# Patient Record
Sex: Female | Born: 1960 | Race: Black or African American | Hispanic: No | Smoking: Never smoker
Health system: Southern US, Community
[De-identification: ages and names within clinical notes are randomized; demographics above are authoritative.]

## PROBLEM LIST (undated history)

## (undated) DIAGNOSIS — E042 Nontoxic multinodular goiter: Secondary | ICD-10-CM

## (undated) DIAGNOSIS — K219 Gastro-esophageal reflux disease without esophagitis: Secondary | ICD-10-CM

## (undated) DIAGNOSIS — M1710 Unilateral primary osteoarthritis, unspecified knee: Secondary | ICD-10-CM

## (undated) DIAGNOSIS — I10 Essential (primary) hypertension: Secondary | ICD-10-CM

## (undated) DIAGNOSIS — E785 Hyperlipidemia, unspecified: Secondary | ICD-10-CM

## (undated) DIAGNOSIS — R42 Dizziness and giddiness: Secondary | ICD-10-CM

## (undated) HISTORY — PX: CARPAL TUNNEL RELEASE: SHX101

## (undated) HISTORY — PX: COLONOSCOPY: SHX174

## (undated) HISTORY — PX: KNEE ARTHROSCOPY: SUR90

## (undated) HISTORY — PX: UPPER GI ENDOSCOPY: SHX6162

## (undated) HISTORY — PX: SHOULDER ARTHROSCOPY W/ ACROMIAL REPAIR: SUR94

---

## 2005-06-17 ENCOUNTER — Ambulatory Visit: Payer: Self-pay | Admitting: Internal Medicine

## 2005-07-21 ENCOUNTER — Ambulatory Visit: Payer: Self-pay | Admitting: Unknown Physician Specialty

## 2007-02-27 ENCOUNTER — Emergency Department: Payer: Self-pay | Admitting: Emergency Medicine

## 2007-02-27 ENCOUNTER — Other Ambulatory Visit: Payer: Self-pay

## 2009-05-06 ENCOUNTER — Emergency Department: Payer: Self-pay | Admitting: Emergency Medicine

## 2009-07-17 ENCOUNTER — Ambulatory Visit: Payer: Self-pay | Admitting: Internal Medicine

## 2009-11-24 HISTORY — PX: ENDOMETRIAL ABLATION: SHX621

## 2009-12-17 ENCOUNTER — Ambulatory Visit: Payer: Self-pay | Admitting: Gastroenterology

## 2010-04-16 ENCOUNTER — Ambulatory Visit: Payer: Self-pay | Admitting: Obstetrics and Gynecology

## 2010-04-19 ENCOUNTER — Ambulatory Visit: Payer: Self-pay | Admitting: Obstetrics and Gynecology

## 2010-05-02 ENCOUNTER — Ambulatory Visit: Payer: Self-pay | Admitting: Obstetrics and Gynecology

## 2010-05-10 ENCOUNTER — Ambulatory Visit: Payer: Self-pay | Admitting: Sports Medicine

## 2010-05-16 ENCOUNTER — Ambulatory Visit: Payer: Self-pay | Admitting: Unknown Physician Specialty

## 2010-05-17 ENCOUNTER — Ambulatory Visit: Payer: Self-pay | Admitting: Unknown Physician Specialty

## 2010-08-09 ENCOUNTER — Ambulatory Visit: Payer: Self-pay | Admitting: Internal Medicine

## 2011-09-15 ENCOUNTER — Ambulatory Visit: Payer: Self-pay | Admitting: Internal Medicine

## 2012-12-01 ENCOUNTER — Ambulatory Visit: Payer: Self-pay | Admitting: Obstetrics and Gynecology

## 2014-06-20 ENCOUNTER — Ambulatory Visit: Payer: Self-pay | Admitting: Internal Medicine

## 2015-05-25 ENCOUNTER — Other Ambulatory Visit: Payer: Self-pay | Admitting: Internal Medicine

## 2015-05-25 DIAGNOSIS — Z1231 Encounter for screening mammogram for malignant neoplasm of breast: Secondary | ICD-10-CM

## 2015-06-22 ENCOUNTER — Ambulatory Visit: Admission: RE | Admit: 2015-06-22 | Payer: Self-pay | Source: Ambulatory Visit

## 2015-07-20 ENCOUNTER — Ambulatory Visit
Admission: RE | Admit: 2015-07-20 | Discharge: 2015-07-20 | Disposition: A | Discharge status: Home / Self Care | Payer: 59 | Source: Ambulatory Visit | Attending: Internal Medicine | Admitting: Internal Medicine

## 2015-07-20 DIAGNOSIS — Z1231 Encounter for screening mammogram for malignant neoplasm of breast: Secondary | ICD-10-CM | POA: Insufficient documentation

## 2016-02-11 ENCOUNTER — Ambulatory Visit (INDEPENDENT_AMBULATORY_CARE_PROVIDER_SITE_OTHER): Payer: 59

## 2016-02-11 ENCOUNTER — Ambulatory Visit
Admission: EM | Admit: 2016-02-11 | Discharge: 2016-02-11 | Disposition: A | Discharge status: Home / Self Care | Payer: 59 | Attending: Emergency Medicine | Admitting: Emergency Medicine

## 2016-02-11 ENCOUNTER — Encounter: Payer: Self-pay | Admitting: Emergency Medicine

## 2016-02-11 DIAGNOSIS — M25551 Pain in right hip: Secondary | ICD-10-CM

## 2016-02-11 DIAGNOSIS — M67912 Unspecified disorder of synovium and tendon, left shoulder: Secondary | ICD-10-CM

## 2016-02-11 DIAGNOSIS — S8001XA Contusion of right knee, initial encounter: Secondary | ICD-10-CM | POA: Diagnosis not present

## 2016-02-11 MED ORDER — NAPROXEN 500 MG PO TABS: 500.0000 mg | ORAL_TABLET | Freq: Two times a day (BID) | ORAL | Status: DC

## 2016-02-11 MED ORDER — METAXALONE 800 MG PO TABS: 800.0000 mg | ORAL_TABLET | Freq: Three times a day (TID) | ORAL | Status: DC
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
## 2016-02-11 NOTE — Discharge Instructions (Signed)
Contusion A contusion is a deep bruise. Contusions are the result of a blunt injury to tissues and muscle fibers under the skin. The injury causes bleeding under the skin. The skin overlying the contusion may turn blue, purple, or yellow. Minor injuries will give you a painless contusion, but more severe contusions may stay painful and swollen for a few weeks.  CAUSES  This condition is usually caused by a blow, trauma, or direct force to an area of the body. SYMPTOMS  Symptoms of this condition include:  Swelling of the injured area.  Pain and tenderness in the injured area.  Discoloration. The area may have redness and then turn blue, purple, or yellow. DIAGNOSIS  This condition is diagnosed based on a physical exam and medical history. An X-ray, CT scan, or MRI may be needed to determine if there are any associated injuries, such as broken bones (fractures). TREATMENT  Specific treatment for this condition depends on what area of the body was injured. In general, the best treatment for a contusion is resting, icing, applying pressure to (compression), and elevating the injured area. This is often called the RICE strategy. Over-the-counter anti-inflammatory medicines may also be recommended for pain control.  HOME CARE INSTRUCTIONS   Rest the injured area.  If directed, apply ice to the injured area:  Put ice in a plastic bag.  Place a towel between your skin and the bag.  Leave the ice on for 20 minutes, 2-3 times per day.  If directed, apply light compression to the injured area using an elastic bandage. Make sure the bandage is not wrapped too tightly. Remove and reapply the bandage as directed by your health care provider.  If possible, raise (elevate) the injured area above the level of your heart while you are sitting or lying down.  Take over-the-counter and prescription medicines only as told by your health care provider. SEEK MEDICAL CARE IF:  Your symptoms do not  improve after several days of treatment.  Your symptoms get worse.  You have difficulty moving the injured area. SEEK IMMEDIATE MEDICAL CARE IF:   You have severe pain.  You have numbness in a hand or foot.  Your hand or foot turns pale or cold.   This information is not intended to replace advice given to you by your health care provider. Make sure you discuss any questions you have with your health care provider.   Document Released: 08/20/2005 Document Revised: 08/01/2015 Document Reviewed: 03/28/2015 Elsevier Interactive Patient Education 2016 [place] therapy can help ease sore, stiff, injured, and tight muscles and joints. Heat relaxes your muscles, which may help ease your pain.  RISKS AND COMPLICATIONS If you have any of the following conditions, do not use heat therapy unless your health care provider has approved:  Poor circulation.  Healing wounds or scarred skin in the area being treated.  Diabetes, heart disease, or high blood pressure.  Not being able to feel (numbness) the area being treated.  Unusual swelling of the area being treated.  Active infections.  Blood clots.  Cancer.  Inability to communicate pain. This may include young children and people who have problems with their brain function (dementia).  Pregnancy. Heat therapy should only be used on old, pre-existing, or long-lasting (chronic) injuries. Do not use heat therapy on new injuries unless directed by your health care provider. HOW TO USE HEAT THERAPY There are several different kinds of heat therapy, including:  Moist heat pack.  Warm water bath.  Hot water bottle.  Electric heating pad.  Heated gel pack.  Heated wrap.  Electric heating pad. Use the heat therapy method suggested by your health care provider. Follow your health care provider's instructions on when and how to use heat therapy. GENERAL HEAT THERAPY RECOMMENDATIONS  Do not sleep while  using heat therapy. Only use heat therapy while you are awake.  Your skin may turn pink while using heat therapy. Do not use heat therapy if your skin turns red.  Do not use heat therapy if you have new pain.  High heat or long exposure to heat can cause burns. Be careful when using heat therapy to avoid burning your skin.  Do not use heat therapy on areas of your skin that are already irritated, such as with a rash or sunburn. SEEK MEDICAL CARE IF:  You have blisters, redness, swelling, or numbness.  You have new pain.  Your pain is worse. MAKE SURE YOU:  Understand these instructions.  Will watch your condition.  Will get help right away if you are not doing well or get worse.   This information is not intended to replace advice given to you by your health care provider. Make sure you discuss any questions you have with your health care provider.   Document Released: 02/02/2012 Document Revised: 12/01/2014 Document Reviewed: 01/03/2014 Elsevier Interactive Patient Education [employer]vier Inc[employer]
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
## 2016-05-14 ENCOUNTER — Other Ambulatory Visit: Payer: Self-pay | Admitting: Internal Medicine

## 2016-05-14 DIAGNOSIS — Z1231 Encounter for screening mammogram for malignant neoplasm of breast: Secondary | ICD-10-CM

## 2016-07-21 ENCOUNTER — Other Ambulatory Visit: Payer: Self-pay | Admitting: Internal Medicine

## 2016-07-21 ENCOUNTER — Ambulatory Visit
Admission: RE | Admit: 2016-07-21 | Discharge: 2016-07-21 | Disposition: A | Discharge status: Home / Self Care | Payer: 59 | Source: Ambulatory Visit | Attending: Internal Medicine | Admitting: Internal Medicine

## 2016-07-21 DIAGNOSIS — Z1231 Encounter for screening mammogram for malignant neoplasm of breast: Secondary | ICD-10-CM | POA: Diagnosis present

## 2017-06-19 ENCOUNTER — Other Ambulatory Visit: Payer: Self-pay | Admitting: Internal Medicine

## 2017-06-19 DIAGNOSIS — Z1231 Encounter for screening mammogram for malignant neoplasm of breast: Secondary | ICD-10-CM

## 2017-07-22 ENCOUNTER — Ambulatory Visit
Admission: RE | Admit: 2017-07-22 | Discharge: 2017-07-22 | Disposition: A | Discharge status: Home / Self Care | Payer: 59 | Source: Ambulatory Visit | Attending: Internal Medicine | Admitting: Internal Medicine

## 2017-07-22 DIAGNOSIS — Z1231 Encounter for screening mammogram for malignant neoplasm of breast: Secondary | ICD-10-CM | POA: Insufficient documentation

## 2017-07-28 ENCOUNTER — Other Ambulatory Visit: Payer: Self-pay | Admitting: Internal Medicine

## 2017-07-28 DIAGNOSIS — N6320 Unspecified lump in the left breast, unspecified quadrant: Secondary | ICD-10-CM

## 2017-07-28 DIAGNOSIS — R928 Other abnormal and inconclusive findings on diagnostic imaging of breast: Secondary | ICD-10-CM

## 2017-08-05 ENCOUNTER — Ambulatory Visit
Admission: RE | Admit: 2017-08-05 | Discharge: 2017-08-05 | Disposition: A | Discharge status: Home / Self Care | Payer: 59 | Source: Ambulatory Visit | Attending: Internal Medicine | Admitting: Internal Medicine

## 2017-08-05 DIAGNOSIS — N6320 Unspecified lump in the left breast, unspecified quadrant: Secondary | ICD-10-CM | POA: Diagnosis not present

## 2017-08-05 DIAGNOSIS — N649 Disorder of breast, unspecified: Secondary | ICD-10-CM | POA: Insufficient documentation

## 2017-08-05 DIAGNOSIS — R928 Other abnormal and inconclusive findings on diagnostic imaging of breast: Secondary | ICD-10-CM

## 2017-08-06 ENCOUNTER — Other Ambulatory Visit: Payer: Self-pay | Admitting: Internal Medicine

## 2017-08-06 DIAGNOSIS — R928 Other abnormal and inconclusive findings on diagnostic imaging of breast: Secondary | ICD-10-CM

## 2017-08-06 DIAGNOSIS — N6489 Other specified disorders of breast: Secondary | ICD-10-CM

## 2017-08-13 ENCOUNTER — Ambulatory Visit
Admission: RE | Admit: 2017-08-13 | Discharge: 2017-08-13 | Disposition: A | Discharge status: Home / Self Care | Payer: 59 | Source: Ambulatory Visit | Attending: Internal Medicine | Admitting: Internal Medicine

## 2017-08-13 DIAGNOSIS — N6489 Other specified disorders of breast: Secondary | ICD-10-CM

## 2017-08-13 DIAGNOSIS — R928 Other abnormal and inconclusive findings on diagnostic imaging of breast: Secondary | ICD-10-CM

## 2017-08-13 HISTORY — PX: BREAST BIOPSY: SHX20

## 2017-08-14 LAB — SURGICAL PATHOLOGY

## 2018-04-03 IMAGING — MG MM BREAST BX W LOC DEV 1ST LESION IMAGE BX SPEC STEREO GUIDE*L*
8 of 18 series · 8 of 30 positions shown · non-contrast
Comparison: Previous exams.

ADDENDUM:
Pathology of the left breast biopsy revealed A. LEFT BREAST, UPPER
OUTER QUADRANT; STEREOTACTIC BIOPSY: FIBROCYSTIC CHANGE WITH
MICROCALCIFICATIONS. This was found to be concordant with Dr. Si Kacang Polong
impression and notes.

Recommendations:  Bilateral screening mammogram in one year.
At the patient's request, results and recommendations were relayed
to the patient by phone by Ceola, Ervista on 08/14/17 at [DATE].
The patient stated she has done well following the biopsy with no
bleeding, bruising, or hematoma. Post biopsy instructions were
reviewed with the patient. All of her questions were answered. She
was encouraged to call the [HOSPITAL] with any further
questions or concerns.
Addendum by Ceola, Ervista on 08/14/17.
CLINICAL DATA: 56-year-old female for tomosynthesis guided biopsy
of an indeterminate left breast asymmetry
EXAM:
LEFT BREAST STEREOTACTIC CORE NEEDLE BIOPSY

[L (1 of 8)]
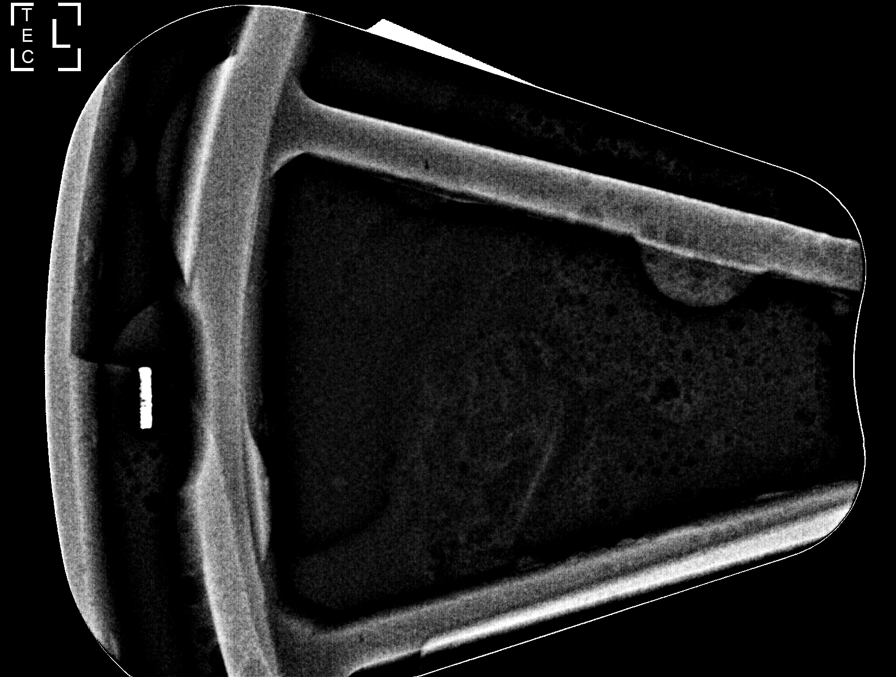

[L (2 of 8)]
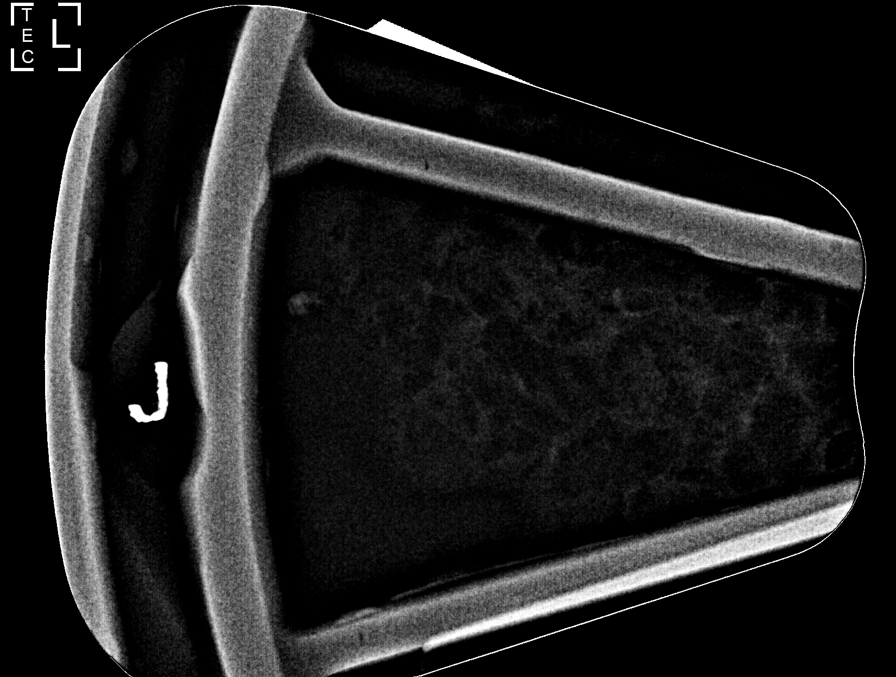

[L (3 of 8)]
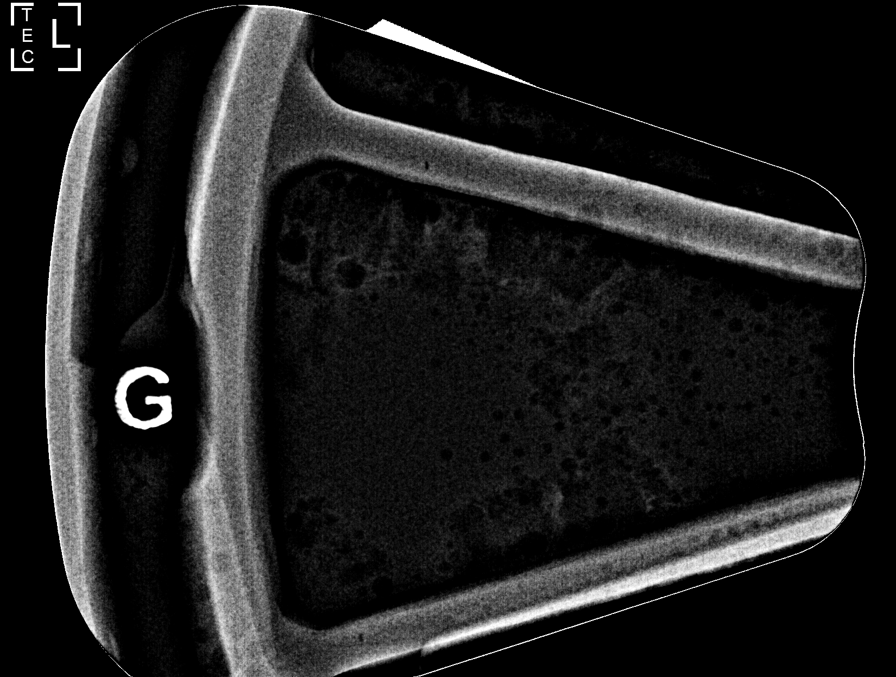

[L (4 of 8)]
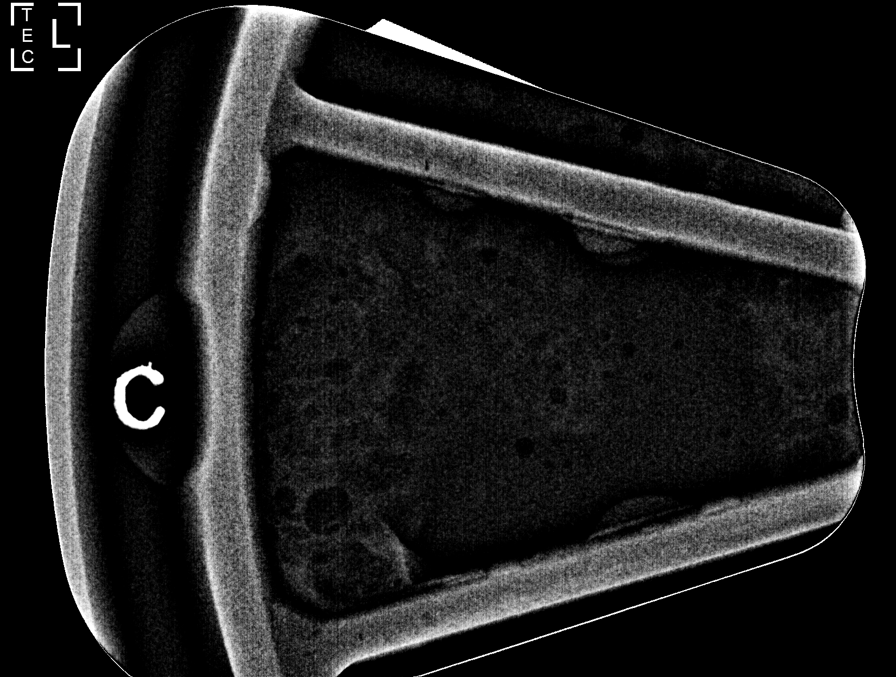

[L (5 of 8)]
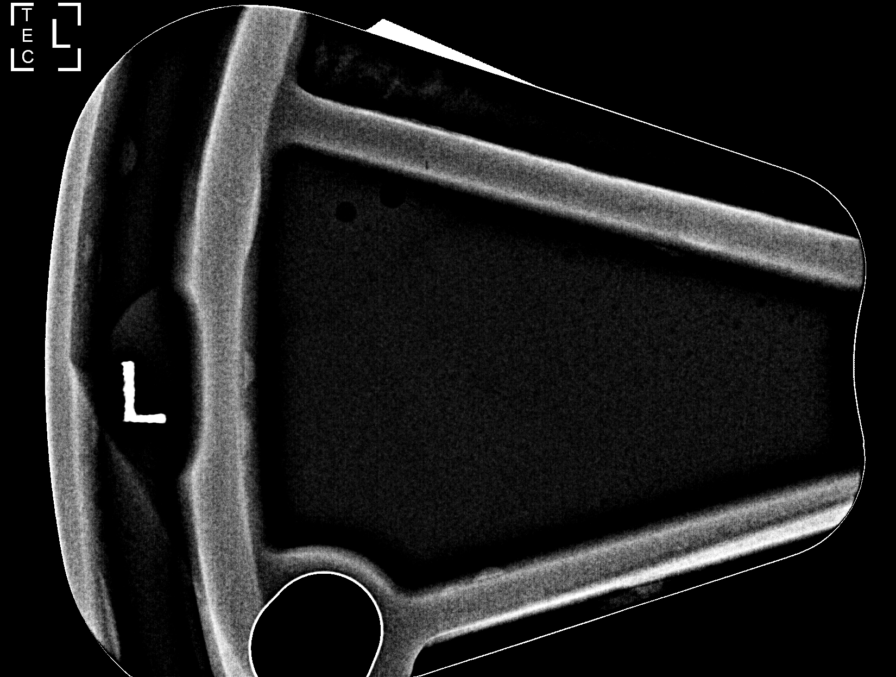

[L (6 of 8)]
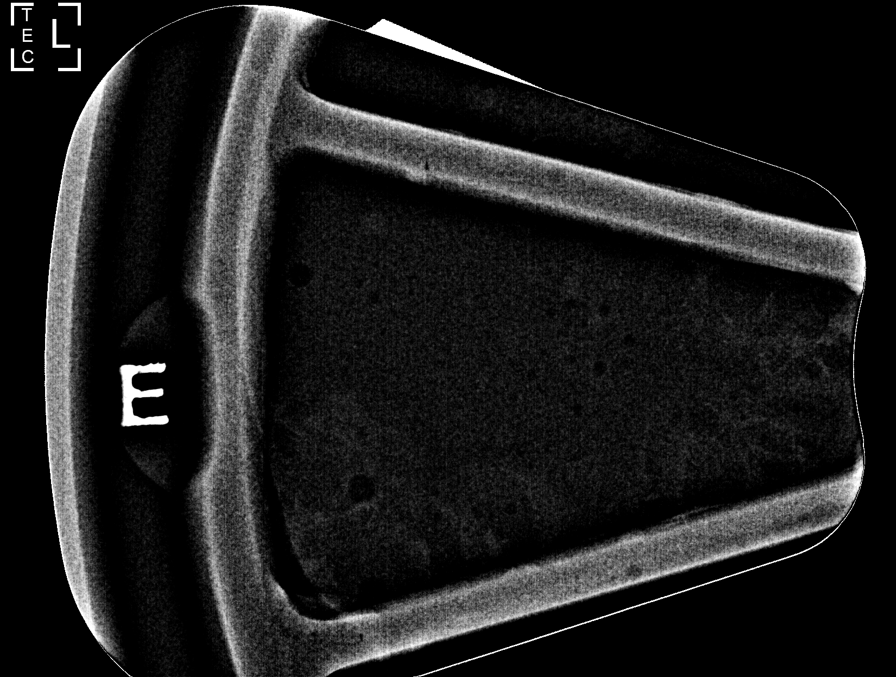

[L (7 of 8)]
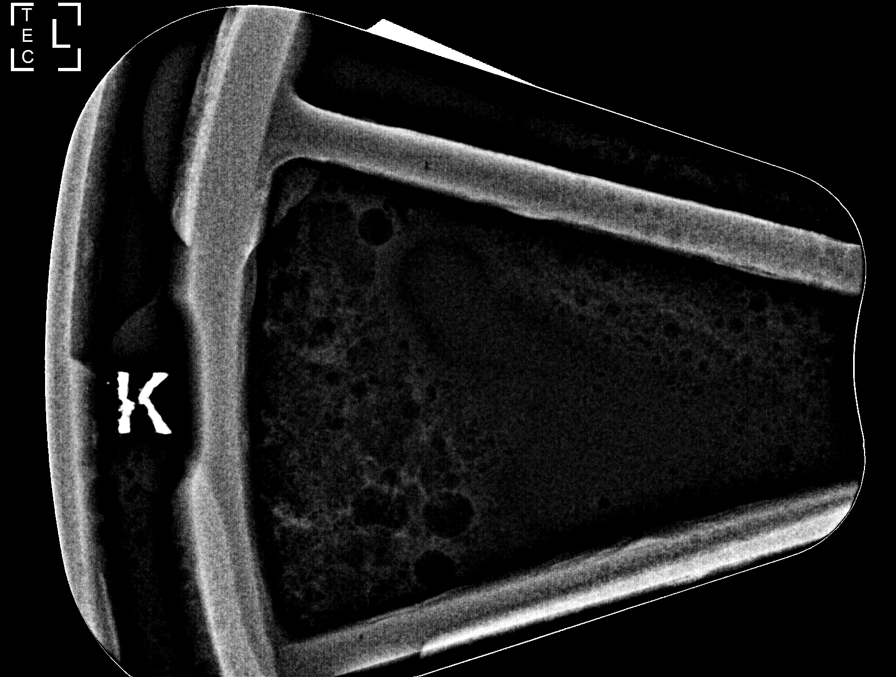

[L (8 of 8)]
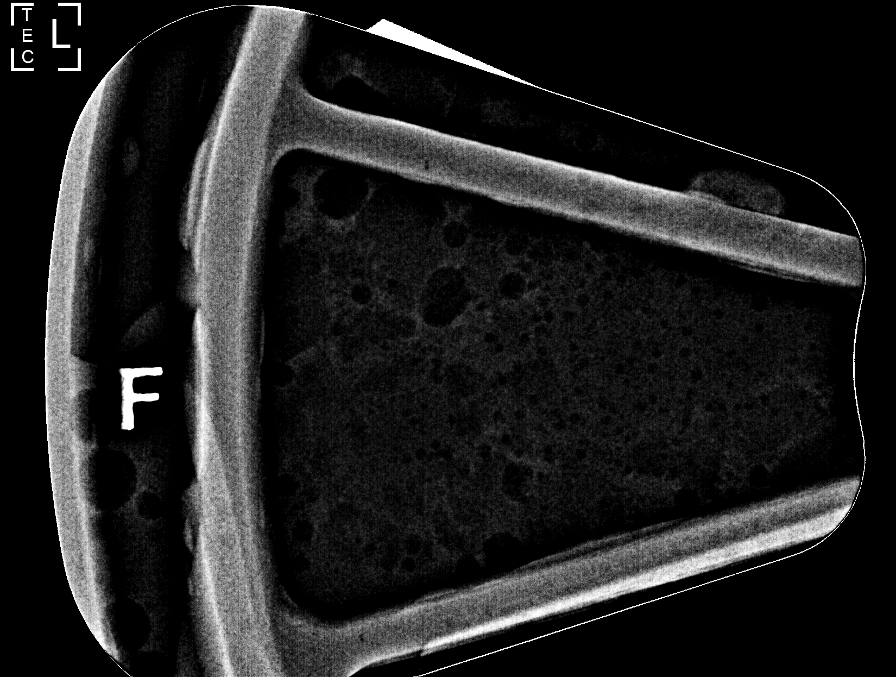

[8 of 30 positions shown; findings below may reference images not displayed]

FINDINGS: The patient and I discussed the procedure of stereotactic/
tomosynthesis -guided biopsy including benefits and alternatives. We
discussed the high likelihood of a successful procedure. We
discussed the risks of the procedure including infection, bleeding,
tissue injury, clip migration, and inadequate sampling. Informed
written consent was given. The usual time out protocol was performed
immediately prior to the procedure.

Using sterile technique and 1% Lidocaine as local anesthetic, under
stereotactic guidance, a 9 gauge vacuum assisted device was used to
perform core needle biopsy of an asymmetry in the upper, outer left
breast using a superior to inferior approach. Specimen radiograph
was performed.

Lesion quadrant: Upper outer quadrant

At the conclusion of the procedure, an X shaped tissue marker clip
was deployed into the biopsy cavity. Follow-up 2-view mammogram was
performed and dictated separately.
IMPRESSION: Stereotactic/ tomosynthesis -guided biopsy of an indeterminate left
breast asymmetry. No apparent complications.

## 2018-08-13 ENCOUNTER — Other Ambulatory Visit: Payer: Self-pay | Admitting: Internal Medicine

## 2018-08-13 DIAGNOSIS — Z1231 Encounter for screening mammogram for malignant neoplasm of breast: Secondary | ICD-10-CM

## 2018-08-24 ENCOUNTER — Ambulatory Visit
Admission: RE | Admit: 2018-08-24 | Discharge: 2018-08-24 | Disposition: A | Discharge status: Home / Self Care | Payer: Managed Care, Other (non HMO) | Source: Ambulatory Visit | Attending: Internal Medicine | Admitting: Internal Medicine

## 2018-08-24 DIAGNOSIS — Z1231 Encounter for screening mammogram for malignant neoplasm of breast: Secondary | ICD-10-CM | POA: Insufficient documentation

## 2019-07-13 ENCOUNTER — Other Ambulatory Visit: Payer: Self-pay | Admitting: Internal Medicine

## 2019-07-13 DIAGNOSIS — Z1231 Encounter for screening mammogram for malignant neoplasm of breast: Secondary | ICD-10-CM

## 2019-08-26 ENCOUNTER — Ambulatory Visit
Admission: RE | Admit: 2019-08-26 | Discharge: 2019-08-26 | Disposition: A | Discharge status: Home / Self Care | Payer: Managed Care, Other (non HMO) | Source: Ambulatory Visit | Attending: Internal Medicine | Admitting: Internal Medicine

## 2019-08-26 DIAGNOSIS — Z1231 Encounter for screening mammogram for malignant neoplasm of breast: Secondary | ICD-10-CM | POA: Diagnosis present

## 2020-02-28 ENCOUNTER — Other Ambulatory Visit: Payer: Self-pay | Admitting: Gastroenterology

## 2020-02-28 DIAGNOSIS — R1312 Dysphagia, oropharyngeal phase: Secondary | ICD-10-CM

## 2020-03-22 ENCOUNTER — Ambulatory Visit
Admission: RE | Admit: 2020-03-22 | Discharge: 2020-03-22 | Disposition: A | Discharge status: Home / Self Care | Payer: Managed Care, Other (non HMO) | Source: Ambulatory Visit | Attending: Gastroenterology | Admitting: Gastroenterology

## 2020-03-22 ENCOUNTER — Other Ambulatory Visit: Payer: Self-pay

## 2020-03-22 DIAGNOSIS — R1312 Dysphagia, oropharyngeal phase: Secondary | ICD-10-CM | POA: Diagnosis present

## 2020-08-24 ENCOUNTER — Other Ambulatory Visit: Payer: Self-pay | Admitting: Internal Medicine

## 2020-08-24 DIAGNOSIS — Z1231 Encounter for screening mammogram for malignant neoplasm of breast: Secondary | ICD-10-CM

## 2020-08-31 ENCOUNTER — Ambulatory Visit
Admission: RE | Admit: 2020-08-31 | Discharge: 2020-08-31 | Disposition: A | Discharge status: Home / Self Care | Payer: Managed Care, Other (non HMO) | Source: Ambulatory Visit | Attending: Internal Medicine | Admitting: Internal Medicine

## 2020-08-31 ENCOUNTER — Other Ambulatory Visit: Payer: Self-pay

## 2020-08-31 DIAGNOSIS — Z1231 Encounter for screening mammogram for malignant neoplasm of breast: Secondary | ICD-10-CM | POA: Diagnosis present

## 2020-10-03 ENCOUNTER — Other Ambulatory Visit: Payer: Managed Care, Other (non HMO) | Attending: Gastroenterology

## 2020-10-05 ENCOUNTER — Other Ambulatory Visit: Payer: Self-pay

## 2020-10-05 ENCOUNTER — Other Ambulatory Visit
Admission: RE | Admit: 2020-10-05 | Discharge: 2020-10-05 | Disposition: A | Discharge status: Home / Self Care | Payer: Managed Care, Other (non HMO) | Source: Ambulatory Visit | Attending: Gastroenterology | Admitting: Gastroenterology

## 2020-10-05 ENCOUNTER — Ambulatory Visit: Admission: RE | Admit: 2020-10-05 | Payer: Managed Care, Other (non HMO) | Source: Home / Self Care

## 2020-10-05 ENCOUNTER — Encounter: Admission: RE | Payer: Self-pay | Source: Home / Self Care

## 2020-10-05 DIAGNOSIS — Z20822 Contact with and (suspected) exposure to covid-19: Secondary | ICD-10-CM | POA: Insufficient documentation

## 2020-10-05 DIAGNOSIS — Z01812 Encounter for preprocedural laboratory examination: Secondary | ICD-10-CM | POA: Insufficient documentation

## 2020-10-05 SURGERY — ESOPHAGOGASTRODUODENOSCOPY (EGD) WITH PROPOFOL
Anesthesia: General

## 2020-10-06 LAB — SARS CORONAVIRUS 2 (TAT 6-24 HRS): SARS Coronavirus 2: NEGATIVE

## 2020-10-08 ENCOUNTER — Encounter: Payer: Self-pay | Admitting: *Deleted

## 2020-10-09 ENCOUNTER — Encounter
Admission: RE | Disposition: A | Discharge status: Home / Self Care | Payer: Self-pay | Source: Home / Self Care | Attending: Gastroenterology

## 2020-10-09 ENCOUNTER — Ambulatory Visit: Payer: Managed Care, Other (non HMO) | Admitting: Anesthesiology

## 2020-10-09 ENCOUNTER — Ambulatory Visit
Admission: RE | Admit: 2020-10-09 | Discharge: 2020-10-09 | Disposition: A | Discharge status: Home / Self Care | Payer: Managed Care, Other (non HMO) | Attending: Gastroenterology | Admitting: Gastroenterology

## 2020-10-09 ENCOUNTER — Other Ambulatory Visit: Payer: Self-pay

## 2020-10-09 ENCOUNTER — Encounter: Payer: Self-pay | Admitting: *Deleted

## 2020-10-09 DIAGNOSIS — K2950 Unspecified chronic gastritis without bleeding: Secondary | ICD-10-CM | POA: Diagnosis not present

## 2020-10-09 DIAGNOSIS — R1310 Dysphagia, unspecified: Secondary | ICD-10-CM | POA: Insufficient documentation

## 2020-10-09 DIAGNOSIS — R1012 Left upper quadrant pain: Secondary | ICD-10-CM | POA: Diagnosis present

## 2020-10-09 DIAGNOSIS — Z791 Long term (current) use of non-steroidal anti-inflammatories (NSAID): Secondary | ICD-10-CM | POA: Insufficient documentation

## 2020-10-09 DIAGNOSIS — Z79899 Other long term (current) drug therapy: Secondary | ICD-10-CM | POA: Insufficient documentation

## 2020-10-09 HISTORY — DX: Nontoxic multinodular goiter: E04.2

## 2020-10-09 HISTORY — DX: Hyperlipidemia, unspecified: E78.5

## 2020-10-09 HISTORY — DX: Unilateral primary osteoarthritis, unspecified knee: M17.10

## 2020-10-09 HISTORY — PX: ESOPHAGOGASTRODUODENOSCOPY (EGD) WITH PROPOFOL: SHX5813

## 2020-10-09 SURGERY — ESOPHAGOGASTRODUODENOSCOPY (EGD) WITH PROPOFOL
Anesthesia: General

## 2020-10-09 MED ORDER — SODIUM CHLORIDE 0.9 % IV SOLN: INTRAVENOUS | Status: DC

## 2020-10-09 MED ORDER — LIDOCAINE HCL (CARDIAC) PF 100 MG/5ML IV SOSY
PREFILLED_SYRINGE | INTRAVENOUS | Status: DC | PRN
  Administered 2020-10-09: 100 mg via INTRAVENOUS

## 2020-10-09 MED ORDER — GLYCOPYRROLATE 0.2 MG/ML IJ SOLN
INTRAMUSCULAR | Status: DC | PRN
  Administered 2020-10-09: .2 mg via INTRAVENOUS

## 2020-10-09 MED ORDER — PROPOFOL 10 MG/ML IV BOLUS
INTRAVENOUS | Status: DC | PRN
  Administered 2020-10-09: 10 mg via INTRAVENOUS
  Administered 2020-10-09: 20 mg via INTRAVENOUS
  Administered 2020-10-09: 50 mg via INTRAVENOUS
  Administered 2020-10-09: 20 mg via INTRAVENOUS

## 2020-10-09 MED ORDER — PROPOFOL 500 MG/50ML IV EMUL
INTRAVENOUS | Status: DC | PRN
  Administered 2020-10-09: 145 ug/kg/min via INTRAVENOUS

## 2020-10-09 NOTE — Anesthesia Postprocedure Evaluation (Signed)
Anesthesia Post Note  Patient: Jessica Ochoa  Procedure(s) Performed: ESOPHAGOGASTRODUODENOSCOPY (EGD) WITH PROPOFOL (N/A )  Patient location during evaluation: Endoscopy Anesthesia Type: General Level of consciousness: awake and alert and oriented Pain management: pain level controlled Vital Signs Assessment: post-procedure vital signs reviewed and stable Respiratory status: spontaneous breathing, nonlabored ventilation and respiratory function stable Cardiovascular status: blood pressure returned to baseline and stable Postop Assessment: no signs of nausea or vomiting Anesthetic complications: no   No complications documented.   Last Vitals:  Vitals:   10/09/20 1229 10/09/20 1239  BP: 128/71 (!) 141/73  Pulse:  86  Resp:    Temp:    SpO2: 97% 99%    Last Pain:  Vitals:   10/09/20 1239  TempSrc:   PainSc: 0-No pain                 Jo Booze

## 2020-10-09 NOTE — Anesthesia Preprocedure Evaluation (Signed)
Anesthesia Evaluation  Patient identified by MRN, date of birth, ID band Patient awake    Reviewed: Allergy & Precautions, NPO status , Patient's Chart, lab work & pertinent test results  History of Anesthesia Complications Negative for: history of anesthetic complications  Airway Mallampati: II  TM Distance: >3 FB Neck ROM: Full    Dental no notable dental hx.    Pulmonary neg pulmonary ROS, neg sleep apnea, neg COPD,    breath sounds clear to auscultation- rhonchi (-) wheezing      Cardiovascular Exercise Tolerance: Good (-) hypertension(-) CAD, (-) Past MI, (-) Cardiac Stents and (-) CABG  Rhythm:Regular Rate:Normal - Systolic murmurs and - Diastolic murmurs    Neuro/Psych neg Seizures negative neurological ROS  negative psych ROS   GI/Hepatic negative GI ROS, Neg liver ROS,   Endo/Other  negative endocrine ROSneg diabetes  Renal/GU negative Renal ROS     Musculoskeletal  (+) Arthritis ,   Abdominal (+) + obese,   Peds  Hematology negative hematology ROS (+)   Anesthesia Other Findings Past Medical History: No date: Hyperlipidemia No date: Multinodular goiter No date: Primary osteoarthritis of knee   Reproductive/Obstetrics                             Anesthesia Physical Anesthesia Plan  ASA: II  Anesthesia Plan: General   Post-op Pain Management:    Induction: Intravenous  PONV Risk Score and Plan: 2 and Propofol infusion  Airway Management Planned: Natural Airway  Additional Equipment:   Intra-op Plan:   Post-operative Plan:   Informed Consent: I have reviewed the patients History and Physical, chart, labs and discussed the procedure including the risks, benefits and alternatives for the proposed anesthesia with the patient or authorized representative who has indicated his/her understanding and acceptance.     Dental advisory given  Plan Discussed with: CRNA  and Anesthesiologist  Anesthesia Plan Comments:         Anesthesia Quick Evaluation

## 2020-10-09 NOTE — Interval H&P Note (Signed)
History and Physical Interval Note:  10/09/2020 11:49 AM  Jessica Ochoa  has presented today for surgery, with the diagnosis of LUQ PAIN.  The various methods of treatment have been discussed with the patient and family. After consideration of risks, benefits and other options for treatment, the patient has consented to  Procedure(s): ESOPHAGOGASTRODUODENOSCOPY (EGD) WITH PROPOFOL (N/A) as a surgical intervention.  The patient's history has been reviewed, patient examined, no change in status, stable for surgery.  I have reviewed the patient's chart and labs.  Questions were answered to the patient's satisfaction.     Regis Bill  Ok to proceed with EGD

## 2020-10-09 NOTE — Op Note (Signed)
[facility] Gastroenterology Patient Name: Jessica Ochoa Procedure Date: 10/09/2020 11:52 AM MRN: [id number] Account #: [id number] Date of Birth: 1961/01/29 Admit Type: Outpatient Age: 59 Room: [facility] ENDO ROOM 1 Gender: Female Note Status: Finalized Procedure:             Upper GI endoscopy Indications:           Abdominal pain in the left upper quadrant, Dysphagia Providers:             Andrey Farmer MD, MD Referring MD:          Rusty Aus, MD (Referring MD) Medicines:             Monitored Anesthesia Care Complications:         No immediate complications. Estimated blood loss:                         Minimal. Procedure:             Pre-Anesthesia Assessment:                        - Prior to the procedure, a History and Physical was                         performed, and patient medications and allergies were                         reviewed. The patient is competent. The risks and                         benefits of the procedure and the sedation options and                         risks were discussed with the patient. All questions                         were answered and informed consent was obtained.                         Patient identification and proposed procedure were                         verified by the physician, the nurse, the anesthetist                         and the technician in the endoscopy suite. Mental                         Status Examination: alert and oriented. Airway                         Examination: normal oropharyngeal airway and neck                         mobility. Respiratory Examination: clear to                         auscultation. CV Examination: normal. Prophylactic  Antibiotics: The patient does not require prophylactic                         antibiotics. Prior Anticoagulants: The patient has                         taken no previous anticoagulant or antiplatelet                          agents. ASA Grade Assessment: III - A patient with                         severe systemic disease. After reviewing the risks and                         benefits, the patient was deemed in satisfactory                         condition to undergo the procedure. The anesthesia                         plan was to use monitored anesthesia care (MAC).                         Immediately prior to administration of medications,                         the patient was re-assessed for adequacy to receive                         sedatives. The heart rate, respiratory rate, oxygen                         saturations, blood pressure, adequacy of pulmonary                         ventilation, and response to care were monitored                         throughout the procedure. The physical status of the                         patient was re-assessed after the procedure.                        After obtaining informed consent, the endoscope was                         passed under direct vision. Throughout the procedure,                         the patient's blood pressure, pulse, and oxygen                         saturations were monitored continuously. The Endoscope                         was introduced through the mouth, and advanced to the  second part of duodenum. The upper GI endoscopy was                         accomplished without difficulty. The patient tolerated                         the procedure well. Findings:      No endoscopic abnormality was evident in the esophagus to explain the       patient's complaint of dysphagia. Biopsies were obtained from the       proximal and distal esophagus with cold forceps for histology of       suspected eosinophilic esophagitis. Estimated blood loss was minimal.      Patchy mild inflammation characterized by erythema was found in the       gastric antrum. Biopsies were taken with a cold forceps for Helicobacter        pylori testing. Estimated blood loss was minimal.      The examined duodenum was normal. Impression:            - No endoscopic esophageal abnormality to explain                         patient's dysphagia. Biopsied.                        - Gastritis. Biopsied.                        - Normal examined duodenum. Recommendation:        - Discharge patient to home.                        - Resume previous diet.                        - Continue present medications.                        - Await pathology results.                        - Return to referring physician as previously                         scheduled. Procedure Code(s):     --- Professional ---                        [phone], Esophagogastroduodenoscopy, flexible,                         transoral; with biopsy, single or multiple Diagnosis Code(s):     --- Professional ---                        R13.10, Dysphagia, unspecified                        K29.70, Gastritis, unspecified, without bleeding                        R10.12, Left upper quadrant pain CPT copyright 2019 [employer]. All rights reserved. The codes documented  in this report are preliminary and upon coder review may  be revised to meet current compliance requirements. Andrey Farmer, MD Andrey Farmer MD, MD 10/09/2020 12:08:58 PM Number of Addenda: 0 Note Initiated On: 10/09/2020 11:52 AM Estimated Blood Loss:  Estimated blood loss was minimal.      [facility]

## 2020-10-09 NOTE — Anesthesia Procedure Notes (Signed)
Procedure Name: General with mask airway Performed by: Fletcher-Harrison, Dortha Neighbors, CRNA Pre-anesthesia Checklist: Patient identified, Emergency Drugs available, Suction available and Patient being monitored Patient Re-evaluated:Patient Re-evaluated prior to induction Oxygen Delivery Method: Simple face mask Induction Type: IV induction Placement Confirmation: positive ETCO2 and CO2 detector Dental Injury: Teeth and Oropharynx as per pre-operative assessment        

## 2020-10-09 NOTE — Transfer of Care (Signed)
Immediate Anesthesia Transfer of Care Note  Patient: Jessica Ochoa  Procedure(s) Performed: ESOPHAGOGASTRODUODENOSCOPY (EGD) WITH PROPOFOL (N/A )  Patient Location: Endoscopy Unit  Anesthesia Type:General  Level of Consciousness: awake, drowsy and patient cooperative  Airway & Oxygen Therapy: Patient Spontanous Breathing  Post-op Assessment: Report given to RN and Post -op Vital signs reviewed and stable  Post vital signs: Reviewed and stable  Last Vitals:  Vitals Value Taken Time  BP 122/61 10/09/20 1209  Temp 36.4 C 10/09/20 1209  Pulse 91 10/09/20 1212  Resp 23 10/09/20 1212  SpO2 98 % 10/09/20 1212  Vitals shown include unvalidated device data.  Last Pain:  Vitals:   10/09/20 1209  TempSrc: Temporal  PainSc: 0-No pain         Complications: No complications documented.

## 2020-10-09 NOTE — H&P (Signed)
Outpatient short stay form Pre-procedure 10/09/2020 11:47 AM Merlyn Lot MD, MPH  Primary Physician: Dr. Hyacinth Meeker  Reason for visit:  LUQ pain and dysphagia  History of present illness:   59 y/o lady here for EGD for above symptoms. 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 Takes NSAIDS daily. No blood thinners. No family history of GI malignancies.    Current Facility-Administered Medications:  .  0.9 %  sodium chloride infusion, , Intravenous, Continuous, Aysia Lowder, Rossie Muskrat, MD, Last Rate: 20 mL/hr at 10/09/20 1147, New Bag at 10/09/20 1147  Medications Prior to Admission  Medication Sig Dispense Refill Last Dose  . buPROPion (WELLBUTRIN XL) 300 MG 24 hr tablet Take 300 mg by mouth daily.   10/08/2020 at Unknown time  . cholecalciferol (VITAMIN D) 1000 units tablet Take 2,000 Units by mouth daily.   10/08/2020 at Unknown time  . etodolac (LODINE) 400 MG tablet Take 400 mg by mouth 2 (two) times daily.   10/08/2020 at Unknown time  . famotidine (PEPCID) 40 MG tablet Take 40 mg by mouth daily.     . hydrochlorothiazide (HYDRODIURIL) 25 MG tablet Take 25 mg by mouth daily.   10/08/2020 at Unknown time  . pantoprazole (PROTONIX) 40 MG tablet Take 40 mg by mouth daily.   10/08/2020 at Unknown time  . simvastatin (ZOCOR) 20 MG tablet Take 20 mg by mouth daily.   10/08/2020 at Unknown time  . naproxen (NAPROSYN) 500 MG tablet Take 1 tablet (500 mg total) by mouth 2 (two) times daily with a meal. 60 tablet 0      No Known Allergies   Past Medical History:  Diagnosis Date  . Hyperlipidemia   . Multinodular goiter   . Primary osteoarthritis of knee     Review of systems:  Otherwise negative.    Physical Exam  Gen: Alert, oriented. Appears stated age.  HEENT: PERRLA. Lungs: No respiratory distress CV: RRR Abd: soft, benign, no masses. Ext: No edema.    Planned  procedures: Proceed with EGD. The patient understands the nature of the planned procedure, indications, risks, alternatives and potential complications including but not limited to bleeding, infection, perforation, damage to internal organs and possible oversedation/side effects from anesthesia. The patient agrees and gives consent to proceed.  Please refer to procedure notes for findings, recommendations and patient disposition/instructions.     Merlyn Lot MD, MPH Gastroenterology 10/09/2020  11:47 AM

## 2020-10-10 ENCOUNTER — Encounter: Payer: Self-pay | Admitting: Gastroenterology

## 2020-10-10 LAB — SURGICAL PATHOLOGY

## 2021-09-02 ENCOUNTER — Encounter: Payer: Self-pay | Admitting: Ophthalmology

## 2021-09-03 ENCOUNTER — Encounter: Payer: Self-pay | Admitting: Anesthesiology

## 2021-09-06 ENCOUNTER — Other Ambulatory Visit: Payer: Self-pay | Admitting: Internal Medicine

## 2021-09-06 DIAGNOSIS — Z1231 Encounter for screening mammogram for malignant neoplasm of breast: Secondary | ICD-10-CM

## 2021-09-17 ENCOUNTER — Ambulatory Visit
Admission: RE | Admit: 2021-09-17 | Payer: Managed Care, Other (non HMO) | Source: Home / Self Care | Admitting: Ophthalmology

## 2021-09-17 HISTORY — DX: Gastro-esophageal reflux disease without esophagitis: K21.9

## 2021-09-17 HISTORY — DX: Dizziness and giddiness: R42

## 2021-09-17 SURGERY — PHACOEMULSIFICATION, CATARACT, WITH IOL INSERTION
Anesthesia: Topical | Laterality: Right

## 2021-09-23 ENCOUNTER — Other Ambulatory Visit: Payer: Self-pay

## 2021-09-23 ENCOUNTER — Ambulatory Visit
Admission: RE | Admit: 2021-09-23 | Discharge: 2021-09-23 | Disposition: A | Discharge status: Home / Self Care | Payer: Managed Care, Other (non HMO) | Source: Ambulatory Visit | Attending: Internal Medicine | Admitting: Internal Medicine

## 2021-09-23 DIAGNOSIS — Z1231 Encounter for screening mammogram for malignant neoplasm of breast: Secondary | ICD-10-CM | POA: Insufficient documentation

## 2021-10-01 ENCOUNTER — Ambulatory Visit
Admission: RE | Admit: 2021-10-01 | Payer: Managed Care, Other (non HMO) | Source: Home / Self Care | Admitting: Ophthalmology

## 2021-10-01 ENCOUNTER — Encounter: Admission: RE | Payer: Self-pay | Source: Home / Self Care

## 2021-10-01 SURGERY — PHACOEMULSIFICATION, CATARACT, WITH IOL INSERTION
Anesthesia: Topical | Laterality: Left

## 2022-08-26 ENCOUNTER — Other Ambulatory Visit: Payer: Self-pay | Admitting: Internal Medicine

## 2022-08-26 DIAGNOSIS — Z1231 Encounter for screening mammogram for malignant neoplasm of breast: Secondary | ICD-10-CM

## 2022-09-24 ENCOUNTER — Ambulatory Visit
Admission: RE | Admit: 2022-09-24 | Discharge: 2022-09-24 | Disposition: A | Discharge status: Home / Self Care | Payer: Managed Care, Other (non HMO) | Source: Ambulatory Visit | Attending: Internal Medicine | Admitting: Internal Medicine

## 2022-09-24 DIAGNOSIS — Z1231 Encounter for screening mammogram for malignant neoplasm of breast: Secondary | ICD-10-CM | POA: Insufficient documentation

## 2023-03-23 ENCOUNTER — Encounter: Payer: Self-pay | Admitting: Ophthalmology

## 2023-03-27 NOTE — Discharge Instructions (Signed)

## 2023-03-31 ENCOUNTER — Ambulatory Visit: Payer: Managed Care, Other (non HMO) | Admitting: Anesthesiology

## 2023-03-31 ENCOUNTER — Encounter
Admission: RE | Disposition: A | Discharge status: Home / Self Care | Payer: Self-pay | Source: Home / Self Care | Attending: Ophthalmology

## 2023-03-31 ENCOUNTER — Ambulatory Visit
Admission: RE | Admit: 2023-03-31 | Discharge: 2023-03-31 | Disposition: A | Discharge status: Home / Self Care | Payer: Managed Care, Other (non HMO) | Attending: Ophthalmology | Admitting: Ophthalmology

## 2023-03-31 ENCOUNTER — Other Ambulatory Visit: Payer: Self-pay

## 2023-03-31 DIAGNOSIS — I10 Essential (primary) hypertension: Secondary | ICD-10-CM | POA: Insufficient documentation

## 2023-03-31 DIAGNOSIS — K219 Gastro-esophageal reflux disease without esophagitis: Secondary | ICD-10-CM | POA: Diagnosis not present

## 2023-03-31 DIAGNOSIS — E785 Hyperlipidemia, unspecified: Secondary | ICD-10-CM | POA: Diagnosis not present

## 2023-03-31 DIAGNOSIS — H2512 Age-related nuclear cataract, left eye: Secondary | ICD-10-CM | POA: Insufficient documentation

## 2023-03-31 HISTORY — PX: CATARACT EXTRACTION W/PHACO: SHX586

## 2023-03-31 HISTORY — DX: Essential (primary) hypertension: I10

## 2023-03-31 SURGERY — PHACOEMULSIFICATION, CATARACT, WITH IOL INSERTION
Anesthesia: Monitor Anesthesia Care | Site: Eye | Laterality: Left

## 2023-03-31 MED ORDER — SIGHTPATH DOSE#1 BSS IO SOLN
INTRAOCULAR | Status: DC | PRN
  Administered 2023-03-31: 55 mL via OPHTHALMIC

## 2023-03-31 MED ORDER — LACTATED RINGERS IV SOLN: INTRAVENOUS | Status: DC

## 2023-03-31 MED ORDER — FENTANYL CITRATE (PF) 100 MCG/2ML IJ SOLN
INTRAMUSCULAR | Status: DC | PRN
  Administered 2023-03-31 (×2): 50 ug via INTRAVENOUS

## 2023-03-31 MED ORDER — SIGHTPATH DOSE#1 BSS IO SOLN
INTRAOCULAR | Status: DC | PRN
  Administered 2023-03-31: 30 mL

## 2023-03-31 MED ORDER — SIGHTPATH DOSE#1 BSS IO SOLN
INTRAOCULAR | Status: DC | PRN
  Administered 2023-03-31: 1 mL via INTRAMUSCULAR

## 2023-03-31 MED ORDER — SIGHTPATH DOSE#1 NA CHONDROIT SULF-NA HYALURON 40-17 MG/ML IO SOLN
INTRAOCULAR | Status: DC | PRN
  Administered 2023-03-31: 1 mL via INTRAOCULAR

## 2023-03-31 MED ORDER — MOXIFLOXACIN HCL 0.5 % OP SOLN
OPHTHALMIC | Status: DC | PRN
  Administered 2023-03-31: .2 mL via OPHTHALMIC

## 2023-03-31 MED ORDER — BRIMONIDINE TARTRATE-TIMOLOL 0.2-0.5 % OP SOLN
OPHTHALMIC | Status: DC | PRN
  Administered 2023-03-31: 1 [drp] via OPHTHALMIC

## 2023-03-31 MED ORDER — TETRACAINE HCL 0.5 % OP SOLN
1.0000 [drp] | OPHTHALMIC | Status: DC | PRN
  Administered 2023-03-31 (×3): 1 [drp] via OPHTHALMIC

## 2023-03-31 MED ORDER — ARMC OPHTHALMIC DILATING DROPS
1.0000 | OPHTHALMIC | Status: DC | PRN
  Administered 2023-03-31 (×3): 1 via OPHTHALMIC

## 2023-03-31 MED ORDER — MIDAZOLAM HCL 2 MG/2ML IJ SOLN
INTRAMUSCULAR | Status: DC | PRN
  Administered 2023-03-31: 2 mg via INTRAVENOUS

## 2023-03-31 SURGICAL SUPPLY — 17 items
ANGLE REVERSE CUT SHRT 25GA (CUTTER) ×1
CANNULA ANT/CHMB 27G (MISCELLANEOUS) IMPLANT
CANNULA ANT/CHMB 27GA (MISCELLANEOUS) IMPLANT
CATARACT SUITE SIGHTPATH (MISCELLANEOUS) ×1 IMPLANT
CYSTOTOME ANGL RVRS SHRT 25G (CUTTER) IMPLANT
CYSTOTOME ANGL RVRS SHRT 25GA (CUTTER) ×1 IMPLANT
FEE CATARACT SUITE SIGHTPATH (MISCELLANEOUS) ×1 IMPLANT
GLOVE BIOGEL PI IND STRL 8 (GLOVE) ×1 IMPLANT
GLOVE SURG ENC TEXT LTX SZ8 (GLOVE) ×1 IMPLANT
LENS IOL TECNIS EYHANCE 20.5 (Intraocular Lens) IMPLANT
NDL FILTER BLUNT 18X1 1/2 (NEEDLE) ×1 IMPLANT
NEEDLE FILTER BLUNT 18X1 1/2 (NEEDLE) ×1 IMPLANT
PACK VIT ANT 23G (MISCELLANEOUS) IMPLANT
RING MALYGIN (MISCELLANEOUS) IMPLANT
SUT ETHILON 10-0 CS-B-6CS-B-6 (SUTURE)
SUTURE EHLN 10-0 CS-B-6CS-B-6 (SUTURE) IMPLANT
SYR 3ML LL SCALE MARK (SYRINGE) ×1 IMPLANT

## 2023-03-31 NOTE — Anesthesia Postprocedure Evaluation (Signed)
Anesthesia Post Note  Patient: Jessica Ochoa  Procedure(s) Performed: CATARACT EXTRACTION PHACO AND INTRAOCULAR LENS PLACEMENT (IOC) LEFT (Left: Eye)  Patient location during evaluation: PACU Anesthesia Type: MAC Level of consciousness: awake and alert Pain management: pain level controlled Vital Signs Assessment: post-procedure vital signs reviewed and stable Respiratory status: spontaneous breathing, nonlabored ventilation, respiratory function stable and patient connected to nasal cannula oxygen Cardiovascular status: stable and blood pressure returned to baseline Postop Assessment: no apparent nausea or vomiting Anesthetic complications: no   No notable events documented.   Last Vitals:  Vitals:   03/31/23 1049 03/31/23 1054  BP: (!) 133/93 (!) 142/86  Pulse: 90 88  Resp:  13  Temp: (!) 36.2 C (!) 36.2 C  SpO2: 100% 98%    Last Pain:  Vitals:   03/31/23 1054  PainSc: 0-No pain                 Kay Shippy C Lorenz Donley

## 2023-03-31 NOTE — Transfer of Care (Signed)
Immediate Anesthesia Transfer of Care Note  Patient: Jessica Ochoa  Procedure(s) Performed: CATARACT EXTRACTION PHACO AND INTRAOCULAR LENS PLACEMENT (IOC) LEFT (Left: Eye)  Patient Location: PACU  Anesthesia Type: MAC  Level of Consciousness: awake, alert  and patient cooperative  Airway and Oxygen Therapy: Patient Spontanous Breathing and Patient connected to supplemental oxygen  Post-op Assessment: Post-op Vital signs reviewed, Patient's Cardiovascular Status Stable, Respiratory Function Stable, Patent Airway and No signs of Nausea or vomiting  Post-op Vital Signs: Reviewed and stable  Complications: No notable events documented.

## 2023-03-31 NOTE — H&P (Signed)
[facility]   Primary Care Physician:  Danella Penton, MD Ophthalmologist: Dr. Druscilla Brownie  Pre-Procedure History & Physical: HPI:  Jessica Ochoa is a 62 y.o. female here for cataract surgery.   Past Medical History:  Diagnosis Date   GERD (gastroesophageal reflux disease)    Hyperlipidemia    Hypertension    Multinodular goiter    Primary osteoarthritis of knee    Vertigo     Past Surgical History:  Procedure Laterality Date   BREAST BIOPSY Left 08/13/2017   bx /clip-neg   CARPAL TUNNEL RELEASE     COLONOSCOPY     ENDOMETRIAL ABLATION  2011   ESOPHAGOGASTRODUODENOSCOPY (EGD) WITH PROPOFOL N/A 10/09/2020   Procedure: ESOPHAGOGASTRODUODENOSCOPY (EGD) WITH PROPOFOL;  Surgeon: Regis Bill, MD;  Location: [facility] END[facility];  Service: Endoscopy;  Laterality: N/A;   KNEE ARTHROSCOPY     SHOULDER ARTHROSCOPY W/ ACROMIAL REPAIR     UPPER GI ENDOSCOPY      Prior to Admission medications   Medication Sig Start Date End Date Taking? Authorizing Provider  acetaminophen (ACETAMINOPHEN 8 HOUR) 650 MG CR tablet Take 1,300 mg by mouth 2 (two) times daily.   Yes [provider]  buPROPion (WELLBUTRIN XL) 300 MG 24 hr tablet Take 300 mg by mouth daily.   Yes [provider]  cholecalciferol (VITAMIN D) 1000 units tablet Take 2,000 Units by mouth daily.   Yes [provider]  olmesartan-hydrochlorothiazide (BENICAR HCT) 20-12.5 MG tablet Take 1 tablet by mouth daily.   Yes [provider]  pantoprazole (PROTONIX) 40 MG tablet Take 40 mg by mouth.   Yes [provider]  simvastatin (ZOCOR) 20 MG tablet Take 20 mg by mouth daily.   Yes [provider]  ELDERBERRY PO Take by mouth daily. Patient not taking: Reported on 03/23/2023    [provider]  hydrochlorothiazide (HYDRODIURIL) 25 MG tablet Take 25 mg by mouth daily. Patient not taking: Reported on 03/23/2023    [provider]    Allergies as of  03/09/2023   (No Known Allergies)    Family History  Problem Relation Age of Onset   Breast cancer Maternal Aunt     Social History   Socioeconomic History   Marital status: Divorced    Spouse name: Not on file   Number of children: Not on file   Years of education: Not on file   Highest education level: Not on file  Occupational History   Not on file  Tobacco Use   Smoking status: Never   Smokeless tobacco: Never  Vaping Use   Vaping Use: Never used  Substance and Sexual Activity   Alcohol use: Yes    Comment: occasional   Drug use: Not on file   Sexual activity: Not on file  Other Topics Concern   Not on file  Social History Narrative   Not on file   Social Determinants of Health   Financial Resource Strain: Not on file  Food Insecurity: Not on file  Transportation Needs: Not on file  Physical Activity: Not on file  Stress: Not on file  Social Connections: Not on file  Intimate Partner Violence: Not on file    Review of Systems: See HPI, otherwise negative ROS  Physical Exam: BP (!) 152/87   Pulse 95   Temp 98.2 F (36.8 C)   Resp 16   Ht 5\' 4"  (1.626 m)   Wt 98.9 kg   SpO2 98%   BMI 37.42  kg/m  General:   Alert, cooperative in NAD Head:  Normocephalic and atraumatic. Respiratory:  Normal work of breathing. Cardiovascular:  RRR  Impression/Plan: Jessica Ochoa is here for cataract surgery.  Risks, benefits, limitations, and alternatives regarding cataract surgery have been reviewed with the patient.  Questions have been answered.  All parties agreeable.   Galen Manila, MD  03/31/2023, 10:25 AM

## 2023-03-31 NOTE — Anesthesia Preprocedure Evaluation (Signed)
Anesthesia Evaluation  Patient identified by MRN, date of birth, ID band Patient awake    Reviewed: Allergy & Precautions, H&P , NPO status , Patient's Chart, lab work & pertinent test results  Airway Mallampati: III  TM Distance: >3 FB Neck ROM: Full    Dental  (+) Chipped Chips noted both upper central incisors:   Pulmonary neg pulmonary ROS   Pulmonary exam normal breath sounds clear to auscultation       Cardiovascular hypertension, negative cardio ROS Normal cardiovascular exam Rhythm:Regular Rate:Normal     Neuro/Psych negative neurological ROS  negative psych ROS   GI/Hepatic Neg liver ROS,GERD  ,,  Endo/Other  negative endocrine ROS    Renal/GU negative Renal ROS  negative genitourinary   Musculoskeletal  (+) Arthritis ,    Abdominal   Peds negative pediatric ROS (+)  Hematology negative hematology ROS (+)   Anesthesia Other Findings Multinodular goiter  Hyperlipidemia Primary osteoarthritis of knee  Vertigo GERD (gastroesophageal reflux disease) Hypertension    Reproductive/Obstetrics negative OB ROS                             Anesthesia Physical Anesthesia Plan  ASA: 2  Anesthesia Plan: MAC   Post-op Pain Management:    Induction: Intravenous  PONV Risk Score and Plan:   Airway Management Planned: Natural Airway and Nasal Cannula  Additional Equipment:   Intra-op Plan:   Post-operative Plan:   Informed Consent: I have reviewed the patients History and Physical, chart, labs and discussed the procedure including the risks, benefits and alternatives for the proposed anesthesia with the patient or authorized representative who has indicated his/her understanding and acceptance.     Dental Advisory Given  Plan Discussed with: Anesthesiologist, CRNA and Surgeon  Anesthesia Plan Comments: (Patient consented for risks of anesthesia including but not limited  to:  - adverse reactions to medications - damage to eyes, teeth, lips or other oral mucosa - nerve damage due to positioning  - sore throat or hoarseness - Damage to heart, brain, nerves, lungs, other parts of body or loss of life  Patient voiced understanding.)       Anesthesia Quick Evaluation

## 2023-03-31 NOTE — Op Note (Signed)
PREOPERATIVE DIAGNOSIS:  Nuclear sclerotic cataract of the left eye.   POSTOPERATIVE DIAGNOSIS:  Nuclear sclerotic cataract of the left eye.   OPERATIVE PROCEDURE:ORPROCALL@   SURGEON:  Galen Manila, MD.   ANESTHESIA:  Anesthesiologist: Marisue Humble, MD CRNA: Barbette Hair, CRNA  1.      Managed anesthesia care. 2.     0.40ml of Shugarcaine was instilled following the paracentesis   COMPLICATIONS:  None.   TECHNIQUE:   Stop and chop   DESCRIPTION OF PROCEDURE:  The patient was examined and consented in the preoperative holding area where the aforementioned topical anesthesia was applied to the left eye and then brought back to the Operating Room where the left eye was prepped and draped in the usual sterile ophthalmic fashion and a lid speculum was placed. A paracentesis was created with the side port blade and the anterior chamber was filled with viscoelastic. A near clear corneal incision was performed with the steel keratome. A continuous curvilinear capsulorrhexis was performed with a cystotome followed by the capsulorrhexis forceps. Hydrodissection and hydrodelineation were carried out with BSS on a blunt cannula. The lens was removed in a stop and chop  technique and the remaining cortical material was removed with the irrigation-aspiration handpiece. The capsular bag was inflated with viscoelastic and the Technis [id number] lens was placed in the capsular bag without complication. The remaining viscoelastic was removed from the eye with the irrigation-aspiration handpiece. The wounds were hydrated. The anterior chamber was flushed with BSS and the eye was inflated to physiologic pressure. 0.63ml Vigamox was placed in the anterior chamber. The wounds were found to be water tight. The eye was dressed with Combigan. The patient was given protective glasses to wear throughout the day and a shield with which to sleep tonight. The patient was also given drops with which to begin a drop regimen  today and will follow-up with me in one day. Implant Name Type Inv. Item Serial No. Manufacturer Lot No. LRB No. Used Action  LENS IOL TECNIS EYHANCE 20.5 - [id number] Intraocular Lens LENS IOL TECNIS EYHANCE 20.5 [id number] [employer]  Left 1 Implanted    Procedure(s) with comments: CATARACT EXTRACTION PHACO AND INTRAOCULAR LENS PLACEMENT (IOC) LEFT (Left) - 3.90  00:28.4  Electronically signed: Galen Manila 03/31/2023 10:48 AM

## 2023-04-01 ENCOUNTER — Encounter: Payer: Self-pay | Admitting: Ophthalmology

## 2023-04-09 ENCOUNTER — Encounter: Payer: Self-pay | Admitting: Ophthalmology

## 2023-04-10 NOTE — Discharge Instructions (Signed)

## 2023-04-14 ENCOUNTER — Ambulatory Visit: Payer: Managed Care, Other (non HMO) | Admitting: Anesthesiology

## 2023-04-14 ENCOUNTER — Ambulatory Visit
Admission: RE | Admit: 2023-04-14 | Discharge: 2023-04-14 | Disposition: A | Discharge status: Home / Self Care | Payer: Managed Care, Other (non HMO) | Attending: Ophthalmology | Admitting: Ophthalmology

## 2023-04-14 ENCOUNTER — Other Ambulatory Visit: Payer: Self-pay

## 2023-04-14 ENCOUNTER — Encounter: Payer: Self-pay | Admitting: Ophthalmology

## 2023-04-14 ENCOUNTER — Encounter
Admission: RE | Disposition: A | Discharge status: Home / Self Care | Payer: Self-pay | Source: Home / Self Care | Attending: Ophthalmology

## 2023-04-14 DIAGNOSIS — K219 Gastro-esophageal reflux disease without esophagitis: Secondary | ICD-10-CM | POA: Diagnosis not present

## 2023-04-14 DIAGNOSIS — E785 Hyperlipidemia, unspecified: Secondary | ICD-10-CM | POA: Diagnosis not present

## 2023-04-14 DIAGNOSIS — H2511 Age-related nuclear cataract, right eye: Secondary | ICD-10-CM | POA: Insufficient documentation

## 2023-04-14 DIAGNOSIS — I10 Essential (primary) hypertension: Secondary | ICD-10-CM | POA: Insufficient documentation

## 2023-04-14 HISTORY — PX: CATARACT EXTRACTION W/PHACO: SHX586

## 2023-04-14 SURGERY — PHACOEMULSIFICATION, CATARACT, WITH IOL INSERTION
Anesthesia: Monitor Anesthesia Care | Site: Eye | Laterality: Right

## 2023-04-14 MED ORDER — SIGHTPATH DOSE#1 NA CHONDROIT SULF-NA HYALURON 40-17 MG/ML IO SOLN
INTRAOCULAR | Status: DC | PRN
  Administered 2023-04-14: 1 mL via INTRAOCULAR

## 2023-04-14 MED ORDER — FENTANYL CITRATE (PF) 100 MCG/2ML IJ SOLN
INTRAMUSCULAR | Status: DC | PRN
  Administered 2023-04-14: 100 ug via INTRAVENOUS

## 2023-04-14 MED ORDER — LACTATED RINGERS IV SOLN: INTRAVENOUS | Status: DC

## 2023-04-14 MED ORDER — BRIMONIDINE TARTRATE-TIMOLOL 0.2-0.5 % OP SOLN
OPHTHALMIC | Status: DC | PRN
  Administered 2023-04-14: 1 [drp] via OPHTHALMIC

## 2023-04-14 MED ORDER — TETRACAINE HCL 0.5 % OP SOLN
1.0000 [drp] | OPHTHALMIC | Status: DC | PRN
  Administered 2023-04-14 (×3): 1 [drp] via OPHTHALMIC

## 2023-04-14 MED ORDER — MOXIFLOXACIN HCL 0.5 % OP SOLN
OPHTHALMIC | Status: DC | PRN
  Administered 2023-04-14: .2 mL via OPHTHALMIC

## 2023-04-14 MED ORDER — SIGHTPATH DOSE#1 BSS IO SOLN
INTRAOCULAR | Status: DC | PRN
  Administered 2023-04-14: 1 mL via INTRAMUSCULAR

## 2023-04-14 MED ORDER — ARMC OPHTHALMIC DILATING DROPS
1.0000 | OPHTHALMIC | Status: DC | PRN
  Administered 2023-04-14 (×3): 1 via OPHTHALMIC

## 2023-04-14 MED ORDER — SIGHTPATH DOSE#1 BSS IO SOLN
INTRAOCULAR | Status: DC | PRN
  Administered 2023-04-14: 15 mL

## 2023-04-14 MED ORDER — ACETAMINOPHEN 500 MG PO TABS
1000.0000 mg | ORAL_TABLET | Freq: Four times a day (QID) | ORAL | Status: DC | PRN
  Administered 2023-04-14: 1000 mg via ORAL

## 2023-04-14 MED ORDER — SIGHTPATH DOSE#1 BSS IO SOLN
INTRAOCULAR | Status: DC | PRN
  Administered 2023-04-14: 54 mL via OPHTHALMIC

## 2023-04-14 MED ORDER — MIDAZOLAM HCL 2 MG/2ML IJ SOLN
INTRAMUSCULAR | Status: DC | PRN
  Administered 2023-04-14: 2 mg via INTRAVENOUS

## 2023-04-14 SURGICAL SUPPLY — 11 items
ANGLE REVERSE CUT SHRT 25GA (CUTTER) ×1
CATARACT SUITE SIGHTPATH (MISCELLANEOUS) ×1 IMPLANT
CYSTOTOME ANGL RVRS SHRT 25G (CUTTER) ×1 IMPLANT
CYSTOTOME ANGL RVRS SHRT 25GA (CUTTER) ×1 IMPLANT
FEE CATARACT SUITE SIGHTPATH (MISCELLANEOUS) ×1 IMPLANT
GLOVE BIOGEL PI IND STRL 8 (GLOVE) ×1 IMPLANT
GLOVE SURG ENC TEXT LTX SZ8 (GLOVE) ×1 IMPLANT
LENS IOL TECNIS EYHANCE 21.5 (Intraocular Lens) IMPLANT
NDL FILTER BLUNT 18X1 1/2 (NEEDLE) ×1 IMPLANT
NEEDLE FILTER BLUNT 18X1 1/2 (NEEDLE) ×1 IMPLANT
SYR 3ML LL SCALE MARK (SYRINGE) ×1 IMPLANT

## 2023-04-14 NOTE — Transfer of Care (Signed)
Immediate Anesthesia Transfer of Care Note  Patient: Jessica Ochoa  Procedure(s) Performed: CATARACT EXTRACTION PHACO AND INTRAOCULAR LENS PLACEMENT (IOC) RIGHT  4.24  00:20.7 (Right: Eye)  Patient Location: PACU  Anesthesia Type: MAC  Level of Consciousness: awake, alert  and patient cooperative  Airway and Oxygen Therapy: Patient Spontanous Breathing and Patient connected to supplemental oxygen  Post-op Assessment: Post-op Vital signs reviewed, Patient's Cardiovascular Status Stable, Respiratory Function Stable, Patent Airway and No signs of Nausea or vomiting  Post-op Vital Signs: Reviewed and stable  Complications: No notable events documented.

## 2023-04-14 NOTE — Anesthesia Preprocedure Evaluation (Signed)
Anesthesia Evaluation  Patient identified by MRN, date of birth, ID band Patient awake    Reviewed: Allergy & Precautions, H&P , NPO status , Patient's Chart, lab work & pertinent test results  Airway Mallampati: III  TM Distance: >3 FB Neck ROM: Full    Dental no notable dental hx. (+) Chipped Chipped upper central incisors:   Pulmonary neg pulmonary ROS   Pulmonary exam normal breath sounds clear to auscultation       Cardiovascular hypertension, negative cardio ROS Normal cardiovascular exam Rhythm:Regular Rate:Normal     Neuro/Psych negative neurological ROS  negative psych ROS   GI/Hepatic Neg liver ROS,GERD  ,,  Endo/Other  negative endocrine ROS    Renal/GU negative Renal ROS  negative genitourinary   Musculoskeletal  (+) Arthritis ,    Abdominal   Peds negative pediatric ROS (+)  Hematology negative hematology ROS (+)   Anesthesia Other Findings Multinodular goiter  Hyperlipidemia Primary osteoarthritis of knee  Vertigo GERD (gastroesophageal reflux disease) Hypertension    Reproductive/Obstetrics negative OB ROS                              Anesthesia Physical Anesthesia Plan  ASA: 2  Anesthesia Plan: MAC   Post-op Pain Management:    Induction: Intravenous  PONV Risk Score and Plan:   Airway Management Planned: Natural Airway and Nasal Cannula  Additional Equipment:   Intra-op Plan:   Post-operative Plan:   Informed Consent: I have reviewed the patients History and Physical, chart, labs and discussed the procedure including the risks, benefits and alternatives for the proposed anesthesia with the patient or authorized representative who has indicated his/her understanding and acceptance.     Dental Advisory Given  Plan Discussed with: Anesthesiologist, CRNA and Surgeon  Anesthesia Plan Comments: (Patient consented for risks of anesthesia including but  not limited to:  - adverse reactions to medications - damage to eyes, teeth, lips or other oral mucosa - nerve damage due to positioning  - sore throat or hoarseness - Damage to heart, brain, nerves, lungs, other parts of body or loss of life  Patient voiced understanding.)         Anesthesia Quick Evaluation

## 2023-04-14 NOTE — H&P (Signed)
[facility]   Primary Care Physician:  Danella Penton, MD Ophthalmologist: Dr. Druscilla Brownie  Pre-Procedure History & Physical: HPI:  Jessica Ochoa is a 62 y.o. female here for cataract surgery.   Past Medical History:  Diagnosis Date   GERD (gastroesophageal reflux disease)    Hyperlipidemia    Hypertension    Multinodular goiter    Primary osteoarthritis of knee    Vertigo     Past Surgical History:  Procedure Laterality Date   BREAST BIOPSY Left 08/13/2017   bx /clip-neg   CARPAL TUNNEL RELEASE     CATARACT EXTRACTION W/PHACO Left 03/31/2023   Procedure: CATARACT EXTRACTION PHACO AND INTRAOCULAR LENS PLACEMENT (IOC) LEFT;  Surgeon: Galen Manila, MD;  Location: [facility];  Service: Ophthalmology;  Laterality: Left;  3.90  00:28.4   COLONOSCOPY     ENDOMETRIAL ABLATION  2011   ESOPHAGOGASTRODUODENOSCOPY (EGD) WITH PROPOFOL N/A 10/09/2020   Procedure: ESOPHAGOGASTRODUODENOSCOPY (EGD) WITH PROPOFOL;  Surgeon: Regis Bill, MD;  Location: [facility];  Service: Endoscopy;  Laterality: N/A;   KNEE ARTHROSCOPY     SHOULDER ARTHROSCOPY W/ ACROMIAL REPAIR     UPPER GI ENDOSCOPY      Prior to Admission medications   Medication Sig Start Date End Date Taking? Authorizing Provider  acetaminophen (ACETAMINOPHEN 8 HOUR) 650 MG CR tablet Take 1,300 mg by mouth 2 (two) times daily.   Yes [provider]  buPROPion (WELLBUTRIN XL) 300 MG 24 hr tablet Take 300 mg by mouth daily.   Yes [provider]  cholecalciferol (VITAMIN D) 1000 units tablet Take 2,000 Units by mouth daily.   Yes [provider]  olmesartan-hydrochlorothiazide (BENICAR HCT) 20-12.5 MG tablet Take 1 tablet by mouth daily.   Yes [provider]  pantoprazole (PROTONIX) 40 MG tablet Take 40 mg by mouth.   Yes [provider]  simvastatin (ZOCOR) 20 MG tablet Take 20 mg by mouth daily.   Yes [provider]  ELDERBERRY PO Take by mouth  daily. Patient not taking: Reported on 03/23/2023    [provider]  hydrochlorothiazide (HYDRODIURIL) 25 MG tablet Take 25 mg by mouth daily. Patient not taking: Reported on 03/23/2023    [provider]    Allergies as of 03/09/2023   (No Known Allergies)    Family History  Problem Relation Age of Onset   Breast cancer Maternal Aunt     Social History   Socioeconomic History   Marital status: Divorced    Spouse name: Not on file   Number of children: Not on file   Years of education: Not on file   Highest education level: Not on file  Occupational History   Not on file  Tobacco Use   Smoking status: Never   Smokeless tobacco: Never  Vaping Use   Vaping Use: Never used  Substance and Sexual Activity   Alcohol use: Yes    Comment: occasional   Drug use: Not on file   Sexual activity: Not on file  Other Topics Concern   Not on file  Social History Narrative   Not on file   Social Determinants of Health   Financial Resource Strain: Not on file  Food Insecurity: Not on file  Transportation Needs: Not on file  Physical Activity: Not on file  Stress: Not on file  Social Connections: Not on file  Intimate Partner Violence: Not on file    Review of Systems: See HPI, otherwise negative ROS  Physical  Exam: BP 139/73   Pulse 93   Temp 97.9 F (36.6 C) (Temporal)   Resp 15   Ht 5' 4.02" (1.626 m)   Wt 100.2 kg   SpO2 99%   BMI 37.90 kg/m  General:   Alert, cooperative in NAD Head:  Normocephalic and atraumatic. Respiratory:  Normal work of breathing. Cardiovascular:  RRR  Impression/Plan: Jessica Ochoa is here for cataract surgery.  Risks, benefits, limitations, and alternatives regarding cataract surgery have been reviewed with the patient.  Questions have been answered.  All parties agreeable.   Galen Manila, MD  04/14/2023, 12:21 PM

## 2023-04-14 NOTE — Op Note (Signed)
PREOPERATIVE DIAGNOSIS:  Nuclear sclerotic cataract of the right eye.   POSTOPERATIVE DIAGNOSIS:  H25.11 Cataract   OPERATIVE PROCEDURE:ORPROCALL@   SURGEON:  Galen Manila, MD.   ANESTHESIA:  Anesthesiologist: Marisue Humble, MD CRNA: Domenic Moras, CRNA  1.      Managed anesthesia care. 2.      0.10ml of Shugarcaine was instilled in the eye following the paracentesis.   COMPLICATIONS:  None.   TECHNIQUE:   Stop and chop   DESCRIPTION OF PROCEDURE:  The patient was examined and consented in the preoperative holding area where the aforementioned topical anesthesia was applied to the right eye and then brought back to the Operating Room where the right eye was prepped and draped in the usual sterile ophthalmic fashion and a lid speculum was placed. A paracentesis was created with the side port blade and the anterior chamber was filled with viscoelastic. A near clear corneal incision was performed with the steel keratome. A continuous curvilinear capsulorrhexis was performed with a cystotome followed by the capsulorrhexis forceps. Hydrodissection and hydrodelineation were carried out with BSS on a blunt cannula. The lens was removed in a stop and chop  technique and the remaining cortical material was removed with the irrigation-aspiration handpiece. The capsular bag was inflated with viscoelastic and the Technis [id number]  lens was placed in the capsular bag without complication. The remaining viscoelastic was removed from the eye with the irrigation-aspiration handpiece. The wounds were hydrated. The anterior chamber was flushed with BSS and the eye was inflated to physiologic pressure. 0.31ml of Vigamox was placed in the anterior chamber. The wounds were found to be water tight. The eye was dressed with Combigan. The patient was given protective glasses to wear throughout the day and a shield with which to sleep tonight. The patient was also given drops with which to begin a drop regimen today  and will follow-up with me in one day. Implant Name Type Inv. Item Serial No. Manufacturer Lot No. LRB No. Used Action  LENS IOL TECNIS EYHANCE 21.5 - [id number] Intraocular Lens LENS IOL TECNIS EYHANCE 21.5 [id number] [employer]  Right 1 Implanted   Procedure(s): CATARACT EXTRACTION PHACO AND INTRAOCULAR LENS PLACEMENT (IOC) RIGHT  4.24  00:20.7 (Right)  Electronically signed: Galen Manila 04/14/2023 12:56 PM

## 2023-04-14 NOTE — Anesthesia Postprocedure Evaluation (Signed)
Anesthesia Post Note  Patient: NYRIE LAUE  Procedure(s) Performed: CATARACT EXTRACTION PHACO AND INTRAOCULAR LENS PLACEMENT (IOC) RIGHT  4.24  00:20.7 (Right: Eye)  Patient location during evaluation: PACU Anesthesia Type: MAC Level of consciousness: awake and alert Pain management: pain level controlled Vital Signs Assessment: post-procedure vital signs reviewed and stable Respiratory status: spontaneous breathing, nonlabored ventilation, respiratory function stable and patient connected to nasal cannula oxygen Cardiovascular status: stable and blood pressure returned to baseline Postop Assessment: no apparent nausea or vomiting Anesthetic complications: no   No notable events documented.   Last Vitals:  Vitals:   04/14/23 1257 04/14/23 1304  BP: 118/82 128/82  Pulse: 85 91  Resp: 15 16  Temp: 36.6 C 36.6 C  SpO2: 99% 98%    Last Pain:  Vitals:   04/14/23 1304  TempSrc:   PainSc: 2                  Osric Klopf C Sharae Zappulla

## 2023-04-15 NOTE — Progress Notes (Signed)
Pt stated  "I felt rushed. Had that been my first eye, I wouldn't have had my second eye done. I don't feel like I was numb enough and I felt everything that was happening."

## 2023-09-07 ENCOUNTER — Other Ambulatory Visit: Payer: Self-pay | Admitting: Internal Medicine

## 2023-09-07 DIAGNOSIS — Z1231 Encounter for screening mammogram for malignant neoplasm of breast: Secondary | ICD-10-CM

## 2023-09-29 ENCOUNTER — Ambulatory Visit
Admission: RE | Admit: 2023-09-29 | Discharge: 2023-09-29 | Disposition: A | Discharge status: Home / Self Care | Payer: Managed Care, Other (non HMO) | Source: Ambulatory Visit | Attending: Internal Medicine | Admitting: Internal Medicine

## 2023-09-29 DIAGNOSIS — Z1231 Encounter for screening mammogram for malignant neoplasm of breast: Secondary | ICD-10-CM | POA: Insufficient documentation

## 2023-10-26 ENCOUNTER — Ambulatory Visit
Admission: EM | Admit: 2023-10-26 | Discharge: 2023-10-26 | Disposition: A | Discharge status: Home / Self Care | Payer: Managed Care, Other (non HMO) | Attending: Internal Medicine | Admitting: Internal Medicine

## 2023-10-26 ENCOUNTER — Ambulatory Visit: Payer: Managed Care, Other (non HMO)

## 2023-10-26 DIAGNOSIS — M25562 Pain in left knee: Secondary | ICD-10-CM

## 2023-10-26 DIAGNOSIS — M25552 Pain in left hip: Secondary | ICD-10-CM

## 2023-10-26 DIAGNOSIS — M1712 Unilateral primary osteoarthritis, left knee: Secondary | ICD-10-CM

## 2023-10-26 DIAGNOSIS — M1612 Unilateral primary osteoarthritis, left hip: Secondary | ICD-10-CM | POA: Diagnosis not present

## 2023-10-26 MED ORDER — MELOXICAM 7.5 MG PO TABS
7.5000 mg | ORAL_TABLET | Freq: Every day | ORAL | 0 refills | Status: AC
Start: 2023-10-26 — End: ?

## 2023-10-26 NOTE — Discharge Instructions (Signed)
Meloxicam daily.  Use the knee brace to the left knee to help support the joint.  Elevate and ice as needed.  Please follow-up with your PCP if symptoms do not improve.  Please go to the ER for any worsening symptoms.  I hope you feel better soon!

## 2023-10-26 NOTE — ED Provider Notes (Signed)
[facility]    CSN: [id number] Arrival date & time: 10/26/23  1132      History   Chief Complaint Chief Complaint  Patient presents with   Knee Pain   Hip Pain    HPI Jessica Ochoa is a 62 y.o. female presents for hip and knee pain.  Patient reports she has had some chronic bilateral knee pain and intermittent hip pain over the past several months.  She saw her PCP in June that told her her knee pain was secondary to arthritis.  She reports her knee pain is worsened particularly on the left and she feels a knot on the posterior aspect of the knee.  Pain is worse with weightbearing movement and extension.  Denies swelling or bruising.  No numbness or tingling.  Also reports lateral hip pain after sitting for long periods of time.  She does work from home and sits often.  No injury to either the knee or to the hip.  She uses over-the-counter Tylenol arthritis, topical over-the-counter medications with minimal improvement.  No other concerns at this time.   Knee Pain Hip Pain    Past Medical History:  Diagnosis Date   GERD (gastroesophageal reflux disease)    Hyperlipidemia    Hypertension    Multinodular goiter    Primary osteoarthritis of knee    Vertigo     There are no problems to display for this patient.   Past Surgical History:  Procedure Laterality Date   BREAST BIOPSY Left 08/13/2017   bx /clip-neg   CARPAL TUNNEL RELEASE     CATARACT EXTRACTION W/PHACO Left 03/31/2023   Procedure: CATARACT EXTRACTION PHACO AND INTRAOCULAR LENS PLACEMENT (IOC) LEFT;  Surgeon: Galen Manila, MD;  Location: [facility];  Service: Ophthalmology;  Laterality: Left;  3.90  00:28.4   CATARACT EXTRACTION W/PHACO Right 04/14/2023   Procedure: CATARACT EXTRACTION PHACO AND INTRAOCULAR LENS PLACEMENT (IOC) RIGHT  4.24  00:20.7;  Surgeon: Galen Manila, MD;  Location: [facility];  Service: Ophthalmology;  Laterality: Right;   COLONOSCOPY     ENDOMETRIAL  ABLATION  2011   ESOPHAGOGASTRODUODENOSCOPY (EGD) WITH PROPOFOL N/A 10/09/2020   Procedure: ESOPHAGOGASTRODUODENOSCOPY (EGD) WITH PROPOFOL;  Surgeon: Regis Bill, MD;  Location: [facility];  Service: Endoscopy;  Laterality: N/A;   KNEE ARTHROSCOPY     SHOULDER ARTHROSCOPY W/ ACROMIAL REPAIR     UPPER GI ENDOSCOPY      OB History   No obstetric history on file.      Home Medications    Prior to Admission medications   Medication Sig Start Date End Date Taking? Authorizing Provider  acetaminophen (ACETAMINOPHEN 8 HOUR) 650 MG CR tablet Take 1,300 mg by mouth 2 (two) times daily.   Yes [provider]  buPROPion (WELLBUTRIN XL) 300 MG 24 hr tablet Take 300 mg by mouth daily.   Yes [provider]  cholecalciferol (VITAMIN D) 1000 units tablet Take 2,000 Units by mouth daily.   Yes [provider]  meloxicam (MOBIC) 7.5 MG tablet Take 1 tablet (7.5 mg total) by mouth daily. 10/26/23  Yes Radford Pax, NP  olmesartan-hydrochlorothiazide (BENICAR HCT) 20-12.5 MG tablet Take 1 tablet by mouth daily.   Yes [provider]  pantoprazole (PROTONIX) 40 MG tablet Take 40 mg by mouth.   Yes [provider]  simvastatin (ZOCOR) 20 MG tablet Take 20 mg by mouth daily.   Yes [provider]  ELDERBERRY PO Take by mouth daily.  Patient not taking: Reported on 03/23/2023    [provider]  hydrochlorothiazide (HYDRODIURIL) 25 MG tablet Take 25 mg by mouth daily. Patient not taking: Reported on 03/23/2023    [provider]    Family History Family History  Problem Relation Age of Onset   Breast cancer Maternal Aunt     Social History Social History   Tobacco Use   Smoking status: Never   Smokeless tobacco: Never  Vaping Use   Vaping status: Never Used  Substance Use Topics   Alcohol use: Yes    Comment: occasional     Allergies   Nickel and Tape   Review of Systems Review of Systems   Musculoskeletal:        Left hip and left knee pain     Physical Exam Triage Vital Signs ED Triage Vitals  Encounter Vitals Group     BP 10/26/23 1225 (!) 154/84     Systolic BP Percentile --      Diastolic BP Percentile --      Pulse Rate 10/26/23 1225 74     Resp 10/26/23 1225 18     Temp 10/26/23 1225 98.6 F (37 C)     Temp Source 10/26/23 1225 Oral     SpO2 10/26/23 1225 98 %     Weight --      Height --      Head Circumference --      Peak Flow --      Pain Score 10/26/23 1224 10     Pain Loc --      Pain Education --      Exclude from Growth Chart --    No data found.  Updated Vital Signs BP (!) 154/84 (BP Location: Left Arm)   Pulse 74   Temp 98.6 F (37 C) (Oral)   Resp 18   SpO2 98%   Visual Acuity Right Eye Distance:   Left Eye Distance:   Bilateral Distance:    Right Eye Near:   Left Eye Near:    Bilateral Near:     Physical Exam Vitals and nursing note reviewed.  Constitutional:      General: She is not in acute distress.    Appearance: Normal appearance. She is not ill-appearing.  HENT:     Head: Normocephalic and atraumatic.  Eyes:     Pupils: Pupils are equal, round, and reactive to light.  Cardiovascular:     Rate and Rhythm: Normal rate.  Pulmonary:     Effort: Pulmonary effort is normal.  Musculoskeletal:     Left hip: Tenderness and bony tenderness present. No deformity, lacerations or crepitus. Normal range of motion. Normal strength.     Left knee: No swelling, deformity, effusion, erythema, ecchymosis, lacerations, bony tenderness or crepitus. Normal range of motion. Tenderness present over the medial joint line and lateral joint line. Normal alignment and normal patellar mobility. Normal pulse.       Legs:     Comments: Pain with internal and external rotation of right hip.  Skin:    General: Skin is warm and dry.  Neurological:     General: No focal deficit present.     Mental Status: She is alert and oriented to person,  place, and time.  Psychiatric:        Mood and Affect: Mood normal.        Behavior: Behavior normal.      UC Treatments / Results  Labs (all labs ordered  are listed, but only abnormal results are displayed) Labs Reviewed - No data to display  EKG   Radiology No results found.  Procedures Procedures (including critical care time)  Medications Ordered in UC Medications - No data to display  Initial Impression / Assessment and Plan / UC Course  I have reviewed the triage vital signs and the nursing notes.  Pertinent labs & imaging results that were available during my care of the patient were reviewed by me and considered in my medical decision making (see chart for details).     Reviewed exam and symptoms with patient.  No red flags.  Wet read of x-rays show arthritis.  Will start meloxicam daily.  Knee brace to left knee for support.  Advised elevation and ice as needed.  PCP follow-up if symptoms do not improve.  ER precautions reviewed. Final Clinical Impressions(s) / UC Diagnoses   Final diagnoses:  Left hip pain  Acute pain of left knee  Osteoarthritis of left knee, unspecified osteoarthritis type  Arthritis of left hip     Discharge Instructions      Meloxicam daily.  Use the knee brace to the left knee to help support the joint.  Elevate and ice as needed.  Please follow-up with your PCP if symptoms do not improve.  Please go to the ER for any worsening symptoms.  I hope you feel better soon!     ED Prescriptions     Medication Sig Dispense Auth. Provider   meloxicam (MOBIC) 7.5 MG tablet Take 1 tablet (7.5 mg total) by mouth daily. 30 tablet Radford Pax, NP      PDMP not reviewed this encounter.   Radford Pax, NP 10/26/23 1335

## 2023-10-26 NOTE — ED Triage Notes (Signed)
Patient states that she went tot he Dr in June and was told her pain was arthritis Patient states that she has a knot behind her left knee that has a burning sensation.  Left knee-hip-leg pain.

## 2024-06-24 ENCOUNTER — Other Ambulatory Visit: Payer: Self-pay

## 2024-06-24 ENCOUNTER — Emergency Department

## 2024-06-24 ENCOUNTER — Encounter: Payer: Self-pay | Admitting: Emergency Medicine

## 2024-06-24 ENCOUNTER — Emergency Department
Admission: EM | Admit: 2024-06-24 | Discharge: 2024-06-25 | Disposition: A | Discharge status: Home / Self Care | Attending: Emergency Medicine | Admitting: Emergency Medicine

## 2024-06-24 DIAGNOSIS — I10 Essential (primary) hypertension: Secondary | ICD-10-CM | POA: Diagnosis not present

## 2024-06-24 DIAGNOSIS — R42 Dizziness and giddiness: Secondary | ICD-10-CM | POA: Diagnosis present

## 2024-06-24 DIAGNOSIS — R079 Chest pain, unspecified: Secondary | ICD-10-CM | POA: Insufficient documentation

## 2024-06-24 LAB — CBC
HCT: 42.8 % (ref 36.0–46.0)
Hemoglobin: 14.1 g/dL (ref 12.0–15.0)
MCH: 28.6 pg (ref 26.0–34.0)
MCHC: 32.9 g/dL (ref 30.0–36.0)
MCV: 86.8 fL (ref 80.0–100.0)
Platelets: 412 K/uL — ABNORMAL HIGH (ref 150–400)
RBC: 4.93 MIL/uL (ref 3.87–5.11)
RDW: 13 % (ref 11.5–15.5)
WBC: 10.8 K/uL — ABNORMAL HIGH (ref 4.0–10.5)
nRBC: 0 % (ref 0.0–0.2)

## 2024-06-24 LAB — BASIC METABOLIC PANEL WITH GFR
Anion gap: 13 (ref 5–15)
BUN: 11 mg/dL (ref 8–23)
CO2: 23 mmol/L (ref 22–32)
Calcium: 9.4 mg/dL (ref 8.9–10.3)
Chloride: 103 mmol/L (ref 98–111)
Creatinine, Ser: 0.74 mg/dL (ref 0.44–1.00)
GFR, Estimated: >60 mL/min (ref >60)
Glucose, Bld: 130 mg/dL — ABNORMAL HIGH (ref 70–99)
Potassium: 3.1 mmol/L — ABNORMAL LOW (ref 3.5–5.1)
Sodium: 139 mmol/L (ref 135–145)

## 2024-06-24 LAB — TROPONIN I (HIGH SENSITIVITY): Troponin I (High Sensitivity): 3 ng/L (ref <18)

## 2024-06-24 MED ORDER — ONDANSETRON 4 MG PO TBDP
4.0000 mg | ORAL_TABLET | Freq: Once | ORAL | Status: AC | PRN
  Administered 2024-06-24: 4 mg via ORAL
  Filled 2024-06-24: qty 1

## 2024-06-24 NOTE — ED Triage Notes (Signed)
 Patient here with dizziness- feels as though the room is spinning this started at 6 p.m. as she was just getting done with work. She was sitting while this happened, got nauseated with one episode of emesis. She reports chest pain in the middle that feels like a constant pressure, non radiating. Denies shortness of breath.

## 2024-06-25 ENCOUNTER — Emergency Department

## 2024-06-25 ENCOUNTER — Encounter: Payer: Self-pay | Admitting: Radiology

## 2024-06-25 LAB — TROPONIN I (HIGH SENSITIVITY): Troponin I (High Sensitivity): 4 ng/L (ref <18)

## 2024-06-25 MED ORDER — ONDANSETRON 4 MG PO TBDP: 4.0000 mg | ORAL_TABLET | Freq: Four times a day (QID) | ORAL | 0 refills | Status: AC | PRN

## 2024-06-25 MED ORDER — MECLIZINE HCL 25 MG PO TABS: 25.0000 mg | ORAL_TABLET | Freq: Two times a day (BID) | ORAL | 0 refills | Status: AC | PRN

## 2024-06-25 MED ORDER — ONDANSETRON 4 MG PO TBDP: 4.0000 mg | ORAL_TABLET | Freq: Once | ORAL | Status: DC

## 2024-06-25 MED ORDER — MECLIZINE HCL 25 MG PO TABS
25.0000 mg | ORAL_TABLET | Freq: Once | ORAL | Status: AC
  Administered 2024-06-25: 25 mg via ORAL
  Filled 2024-06-25: qty 1

## 2024-06-25 NOTE — ED Provider Notes (Signed)
 [facility] Provider Note    Event Date/Time   First MD Initiated Contact with Patient 06/24/24 2352     (approximate)   History   Dizziness and Chest Pain   HPI  Jessica Ochoa is a 63 y.o. female   history of goiter, reflux, vertigo, hypertension  Patient reports that around 5 or 6 PM she has just finished using her computer.  She closed her laptop and stood up and suddenly started feeling she was dizzy like a spinning feeling.  She reports it came on very suddenly.  There is no difficulty speaking she did not fall.  No numbness or weakness.  No facial droop.  She reports it came on very suddenly.  She had some very slight symptoms like this in the past but never anything of this nature  She notices when she moves her head it seems to make it worse.  Not long after that she developed a slight feeling of a chest pressure that is gone away.  Cannot describe that too well, but overall that seems to improved  No vomiting.  No headache.  No shortness of breath.  No recent illness  Does not have any history of vertigo or stroke      Physical Exam   Triage Vital Signs: ED Triage Vitals  Encounter Vitals Group     BP 06/24/24 1953 (!) 167/87     Girls Systolic BP Percentile --      Girls Diastolic BP Percentile --      Boys Systolic BP Percentile --      Boys Diastolic BP Percentile --      Pulse Rate 06/24/24 1953 80     Resp 06/24/24 1953 16     Temp 06/24/24 1955 98.4 F (36.9 C)     Temp src --      SpO2 06/24/24 1953 100 %     Weight 06/24/24 1953 220 lb 7.4 oz (100 kg)     Height 06/24/24 1953 5' 4 (1.626 m)     Head Circumference --      Peak Flow --      Pain Score 06/24/24 1953 7     Pain Loc --      Pain Education --      Exclude from Growth Chart --     Most recent vital signs: Vitals:   06/24/24 1953 06/24/24 1955  BP: (!) 167/87   Pulse: 80   Resp: 16   Temp:  98.4 F (36.9 C)  SpO2: 100%      General: Awake, no  distress.  Very pleasant. CV:  Good peripheral perfusion.  Normal tones and rate Resp:  Normal effort.  Clear bilateral Abd:  No distention.  Other:  No pronator drift in extremity.  Demonstrates 5-5 strength all extremities.  Normal facial expressions.  Normal extraocular movements with pupils equal round reactive to light.  Tympanic membranes normal bilaterally.  No facial rashes or rashes around the ears or in the ear canals.  Normal facial expressions and extraocular movements.   ED Results / Procedures / Treatments   Labs (all labs ordered are listed, but only abnormal results are displayed) Labs Reviewed  BASIC METABOLIC PANEL WITH GFR - Abnormal; Notable for the following components:      Result Value   Potassium 3.1 (*)    Glucose, Bld 130 (*)    All other components within normal limits  CBC - Abnormal; Notable for the following  components:   WBC 10.8 (*)    Platelets 412 (*)    All other components within normal limits  TROPONIN I (HIGH SENSITIVITY)  TROPONIN I (HIGH SENSITIVITY)     EKG  EKG interpreted by me at 12:14 AM heart rate 75 QRS 90 QTc 460 Normal sinus rhythm slight artifact, no evidence of acute ischemia.  Qt upper limit normal.      RADIOLOGY  MR BRAIN WO CONTRAST Result Date: 06/25/2024 EXAM: MRI BRAIN WITHOUT CONTRAST 06/25/2024 01:47:20 AM TECHNIQUE: Multiplanar multisequence MRI of the head/brain was performed without the administration of intravenous contrast. COMPARISON: None available. CLINICAL HISTORY: Ataxia, nontraumatic, stroke excluded; question vertigo, exclude stroke. Patient here with dizziness- feels as though the room is spinning this started at 6 p.m. as she was just getting done with work. She was sitting while this happened, got nauseated with one episode of emesis. She reports chest pain in the middle that ; feels like a constant pressure, non radiating. Denies shortness of breath. FINDINGS: BRAIN AND VENTRICLES: No acute infarct. No  intracranial hemorrhage. No mass. No midline shift. No hydrocephalus. The sella is unremarkable. Normal flow voids. Minimal nonspecific white matter T2-weighted signal hyperintensities, which may be associated with early chronic small vessel disease or migraine headaches. ORBITS: No acute abnormality. SINUSES AND MASTOIDS: No acute abnormality. BONES AND SOFT TISSUES: Normal marrow signal. No acute soft tissue abnormality. IMPRESSION: 1. No acute intracranial abnormality. 2. Minimal nonspecific white matter T2-weighted signal hyperintensities, possibly related to early chronic small vessel disease or migraine headaches. Electronically signed by: Franky Stanford MD 06/25/2024 01:53 AM EDT RP Workstation: [id number]   DG Chest 2 View Result Date: 06/24/2024 CLINICAL DATA:  Chest pain and dizziness.  Nausea and vomiting. EXAM: CHEST - 2 VIEW COMPARISON:  05/06/2009 FINDINGS: Normal heart size and pulmonary vascularity. No focal airspace disease or consolidation in the lungs. No blunting of costophrenic angles. No pneumothorax. Mediastinal contours appear intact. Degenerative changes in the spine and shoulders. IMPRESSION: No active cardiopulmonary disease. Electronically Signed   By: Elsie Gravely M.D.   On: 06/24/2024 20:18     CT imaging performed to exclude cause such as central cause of like stroke.  Imaging reassuring.  Some nonspecific findings noted, notation of possible small vessel disease or migraine headaches.  Patient does not seem to have a significant headache component today   PROCEDURES:  Critical Care performed: No  Procedures   MEDICATIONS ORDERED IN ED: Medications  ondansetron  (ZOFRAN -ODT) disintegrating tablet 4 mg (4 mg Oral Given 06/24/24 2003)  meclizine  (ANTIVERT ) tablet 25 mg (25 mg Oral Given 06/25/24 0042)     IMPRESSION / MDM / ASSESSMENT AND PLAN / ED COURSE  I reviewed the triage vital signs and the nursing notes.                              Differential diagnosis  includes, but is not limited to, based on clinical history and exam likely peripheral vertigo, but given age and abrupt nature of symptoms also which includes central neurologic cause.  She relates a nonspecific chest pressure that came not long after that has resolved.  Her workup including ECG and troponins are normal and I think this is extremely atypical, with the presenting symptom of primarily spinning feeling and dizziness seems unlikely to be related.  She does not have any associated neck pain or neurologic/central focal deficits.  Doubt acute cause such as stroke, carotid  dissection, ACS.  No tachycardia no hypoxia no findings that would suggest thromboembolism.  Patient's presentation is most consistent with acute complicated illness / injury requiring diagnostic workup.  Lab testing reassuring, mild hypokalemia replete.  CBC normal except for minimal leukocytosis which is nonspecific and without associated infectious symptoms highly doubt acute infectious cause.  ----------------------------------------- 4:15 AM on 06/25/2024 ----------------------------------------- With use of meclizine  and vertigo the patient feels improved.  Will discharge with as needed meclizine  vertigo recommendation follow-up in ENT  Return precautions and treatment recommendations and follow-up discussed with the patient who is agreeable with the plan.  Counseled patient recommended against driving until she is certain her symptoms have been resolved for a day or 2, she is in agreement.  Her brother is driving her home today       FINAL CLINICAL IMPRESSION(S) / ED DIAGNOSES   Final diagnoses:  Vertigo  Chest pain with low risk for cardiac etiology     Rx / DC Orders   ED Discharge Orders          Ordered    meclizine  (ANTIVERT ) 25 MG tablet  2 times daily PRN        06/25/24 0411    ondansetron  (ZOFRAN -ODT) 4 MG disintegrating tablet  Every 6 hours PRN        06/25/24 0411              Note:  This document was prepared using Dragon voice recognition software and may include unintentional dictation errors.   Dicky Anes, MD 06/25/24 [phone]

## 2024-06-25 NOTE — Discharge Instructions (Signed)
 No driving today or within 8 hours of using meclizine .

## 2024-08-22 ENCOUNTER — Other Ambulatory Visit: Payer: Self-pay | Admitting: Internal Medicine

## 2024-08-22 DIAGNOSIS — Z1231 Encounter for screening mammogram for malignant neoplasm of breast: Secondary | ICD-10-CM

## 2024-09-29 ENCOUNTER — Ambulatory Visit
Admission: RE | Admit: 2024-09-29 | Discharge: 2024-09-29 | Disposition: A | Discharge status: Home / Self Care | Source: Ambulatory Visit | Attending: Internal Medicine | Admitting: Internal Medicine

## 2024-09-29 DIAGNOSIS — Z1231 Encounter for screening mammogram for malignant neoplasm of breast: Secondary | ICD-10-CM | POA: Diagnosis present
